# Patient Record
Sex: Male | Born: 1985 | Race: White | Hispanic: No | Marital: Single | Smoking: Current every day smoker
Health system: Southern US, Community
[De-identification: ages and names within clinical notes are randomized; demographics above are authoritative.]

## PROBLEM LIST (undated history)

## (undated) DIAGNOSIS — B1920 Unspecified viral hepatitis C without hepatic coma: Secondary | ICD-10-CM

## (undated) DIAGNOSIS — R519 Headache, unspecified: Secondary | ICD-10-CM

## (undated) DIAGNOSIS — R51 Headache: Secondary | ICD-10-CM

## (undated) DIAGNOSIS — J45909 Unspecified asthma, uncomplicated: Secondary | ICD-10-CM

## (undated) DIAGNOSIS — F1910 Other psychoactive substance abuse, uncomplicated: Secondary | ICD-10-CM

## (undated) DIAGNOSIS — B1910 Unspecified viral hepatitis B without hepatic coma: Secondary | ICD-10-CM

## (undated) DIAGNOSIS — K219 Gastro-esophageal reflux disease without esophagitis: Secondary | ICD-10-CM

## (undated) DIAGNOSIS — N179 Acute kidney failure, unspecified: Secondary | ICD-10-CM

## (undated) HISTORY — PX: NO PAST SURGERIES: SHX2092

## (undated) HISTORY — DX: Unspecified viral hepatitis B without hepatic coma: B19.10

---

## 2018-06-05 ENCOUNTER — Encounter (HOSPITAL_COMMUNITY): Payer: Self-pay | Admitting: *Deleted

## 2018-06-05 ENCOUNTER — Inpatient Hospital Stay (HOSPITAL_COMMUNITY)
Admission: EM | Admit: 2018-06-05 | Discharge: 2018-06-10 | DRG: 918 | Disposition: A | Discharge status: Home / Self Care | Payer: Self-pay | Attending: Internal Medicine | Admitting: Internal Medicine

## 2018-06-05 ENCOUNTER — Emergency Department (HOSPITAL_COMMUNITY): Payer: Self-pay

## 2018-06-05 ENCOUNTER — Other Ambulatory Visit: Payer: Self-pay

## 2018-06-05 DIAGNOSIS — R7989 Other specified abnormal findings of blood chemistry: Secondary | ICD-10-CM

## 2018-06-05 DIAGNOSIS — I959 Hypotension, unspecified: Secondary | ICD-10-CM | POA: Diagnosis present

## 2018-06-05 DIAGNOSIS — F329 Major depressive disorder, single episode, unspecified: Secondary | ICD-10-CM | POA: Diagnosis present

## 2018-06-05 DIAGNOSIS — R945 Abnormal results of liver function studies: Secondary | ICD-10-CM

## 2018-06-05 DIAGNOSIS — T43621A Poisoning by amphetamines, accidental (unintentional), initial encounter: Secondary | ICD-10-CM | POA: Diagnosis present

## 2018-06-05 DIAGNOSIS — Z818 Family history of other mental and behavioral disorders: Secondary | ICD-10-CM

## 2018-06-05 DIAGNOSIS — R740 Nonspecific elevation of levels of transaminase and lactic acid dehydrogenase [LDH]: Secondary | ICD-10-CM

## 2018-06-05 DIAGNOSIS — R7401 Elevation of levels of liver transaminase levels: Secondary | ICD-10-CM | POA: Diagnosis present

## 2018-06-05 DIAGNOSIS — F1120 Opioid dependence, uncomplicated: Secondary | ICD-10-CM | POA: Diagnosis present

## 2018-06-05 DIAGNOSIS — F419 Anxiety disorder, unspecified: Secondary | ICD-10-CM | POA: Diagnosis present

## 2018-06-05 DIAGNOSIS — R000 Tachycardia, unspecified: Secondary | ICD-10-CM | POA: Diagnosis present

## 2018-06-05 DIAGNOSIS — T50904A Poisoning by unspecified drugs, medicaments and biological substances, undetermined, initial encounter: Secondary | ICD-10-CM | POA: Diagnosis present

## 2018-06-05 DIAGNOSIS — F1910 Other psychoactive substance abuse, uncomplicated: Secondary | ICD-10-CM

## 2018-06-05 DIAGNOSIS — F1920 Other psychoactive substance dependence, uncomplicated: Secondary | ICD-10-CM | POA: Diagnosis present

## 2018-06-05 DIAGNOSIS — R6510 Systemic inflammatory response syndrome (SIRS) of non-infectious origin without acute organ dysfunction: Secondary | ICD-10-CM | POA: Diagnosis present

## 2018-06-05 DIAGNOSIS — N179 Acute kidney failure, unspecified: Secondary | ICD-10-CM | POA: Diagnosis present

## 2018-06-05 DIAGNOSIS — T402X1A Poisoning by other opioids, accidental (unintentional), initial encounter: Principal | ICD-10-CM | POA: Diagnosis present

## 2018-06-05 DIAGNOSIS — K828 Other specified diseases of gallbladder: Secondary | ICD-10-CM | POA: Diagnosis present

## 2018-06-05 DIAGNOSIS — F17200 Nicotine dependence, unspecified, uncomplicated: Secondary | ICD-10-CM | POA: Diagnosis present

## 2018-06-05 DIAGNOSIS — E871 Hypo-osmolality and hyponatremia: Secondary | ICD-10-CM

## 2018-06-05 DIAGNOSIS — E876 Hypokalemia: Secondary | ICD-10-CM | POA: Diagnosis present

## 2018-06-05 DIAGNOSIS — B169 Acute hepatitis B without delta-agent and without hepatic coma: Secondary | ICD-10-CM | POA: Diagnosis present

## 2018-06-05 DIAGNOSIS — F1520 Other stimulant dependence, uncomplicated: Secondary | ICD-10-CM | POA: Diagnosis present

## 2018-06-05 HISTORY — DX: Unspecified viral hepatitis C without hepatic coma: B19.20

## 2018-06-05 HISTORY — DX: Gastro-esophageal reflux disease without esophagitis: K21.9

## 2018-06-05 HISTORY — DX: Acute kidney failure, unspecified: N17.9

## 2018-06-05 HISTORY — DX: Other psychoactive substance abuse, uncomplicated: F19.10

## 2018-06-05 HISTORY — DX: Unspecified asthma, uncomplicated: J45.909

## 2018-06-05 HISTORY — DX: Headache: R51

## 2018-06-05 HISTORY — DX: Headache, unspecified: R51.9

## 2018-06-05 LAB — BASIC METABOLIC PANEL
Anion gap: 12 (ref 5–15)
BUN: 17 mg/dL (ref 6–20)
CO2: 18 mmol/L — ABNORMAL LOW (ref 22–32)
Calcium: 8.3 mg/dL — ABNORMAL LOW (ref 8.9–10.3)
Chloride: 102 mmol/L (ref 98–111)
Creatinine, Ser: 1.56 mg/dL — ABNORMAL HIGH (ref 0.61–1.24)
GFR calc Af Amer: >60 mL/min (ref >60)
GFR calc non Af Amer: 58 mL/min — ABNORMAL LOW (ref >60)
GLUCOSE: 119 mg/dL — AB (ref 70–99)
Potassium: 3 mmol/L — ABNORMAL LOW (ref 3.5–5.1)
Sodium: 132 mmol/L — ABNORMAL LOW (ref 135–145)

## 2018-06-05 LAB — CBC
HEMATOCRIT: 44.2 % (ref 39.0–52.0)
Hemoglobin: 14.5 g/dL (ref 13.0–17.0)
MCH: 27.7 pg (ref 26.0–34.0)
MCHC: 32.8 g/dL (ref 30.0–36.0)
MCV: 84.5 fL (ref 80.0–100.0)
Platelets: 221 10*3/uL (ref 150–400)
RBC: 5.23 MIL/uL (ref 4.22–5.81)
RDW: 13.1 % (ref 11.5–15.5)
WBC: 4.4 10*3/uL (ref 4.0–10.5)
nRBC: 0 % (ref 0.0–0.2)

## 2018-06-05 LAB — MAGNESIUM: Magnesium: 0.9 mg/dL — CL (ref 1.7–2.4)

## 2018-06-05 LAB — HEPATIC FUNCTION PANEL
ALT: 1605 U/L — ABNORMAL HIGH (ref 0–44)
AST: 652 U/L — AB (ref 15–41)
Albumin: 3.3 g/dL — ABNORMAL LOW (ref 3.5–5.0)
Alkaline Phosphatase: 159 U/L — ABNORMAL HIGH (ref 38–126)
Bilirubin, Direct: 1.9 mg/dL — ABNORMAL HIGH (ref 0.0–0.2)
Indirect Bilirubin: 1.5 mg/dL — ABNORMAL HIGH (ref 0.3–0.9)
Total Bilirubin: 3.4 mg/dL — ABNORMAL HIGH (ref 0.3–1.2)
Total Protein: 6.6 g/dL (ref 6.5–8.1)

## 2018-06-05 LAB — I-STAT TROPONIN, ED: Troponin i, poc: 0 ng/mL (ref 0.00–0.08)

## 2018-06-05 LAB — SALICYLATE LEVEL: Salicylate Lvl: <7 mg/dL (ref 2.8–30.0)

## 2018-06-05 LAB — PROTIME-INR
INR: 1.42
Prothrombin Time: 17.2 seconds — ABNORMAL HIGH (ref 11.4–15.2)

## 2018-06-05 LAB — ETHANOL: Alcohol, Ethyl (B): <10 mg/dL (ref <10)

## 2018-06-05 LAB — ACETAMINOPHEN LEVEL: Acetaminophen (Tylenol), Serum: <10 ug/mL — ABNORMAL LOW (ref 10–30)

## 2018-06-05 MED ORDER — POTASSIUM CHLORIDE 20 MEQ PO PACK
40.0000 meq | PACK | Freq: Once | ORAL | Status: DC
  Filled 2018-06-05: qty 2

## 2018-06-05 MED ORDER — POTASSIUM CHLORIDE CRYS ER 20 MEQ PO TBCR
40.0000 meq | EXTENDED_RELEASE_TABLET | Freq: Once | ORAL | Status: AC
  Administered 2018-06-05: 40 meq via ORAL
  Filled 2018-06-05: qty 2

## 2018-06-05 MED ORDER — LORAZEPAM 2 MG/ML IJ SOLN
2.0000 mg | Freq: Once | INTRAMUSCULAR | Status: AC
  Administered 2018-06-05: 2 mg via INTRAVENOUS
  Filled 2018-06-05: qty 1

## 2018-06-05 MED ORDER — LORAZEPAM 2 MG/ML IJ SOLN
2.0000 mg | Freq: Once | INTRAMUSCULAR | Status: AC
  Administered 2018-06-06: 2 mg via INTRAVENOUS
  Filled 2018-06-05: qty 1

## 2018-06-05 MED ORDER — LORAZEPAM 1 MG PO TABS
2.0000 mg | ORAL_TABLET | Freq: Once | ORAL | Status: AC
  Administered 2018-06-05: 2 mg via ORAL
  Filled 2018-06-05: qty 2

## 2018-06-05 MED ORDER — POTASSIUM CHLORIDE 10 MEQ/100ML IV SOLN: 10.0000 meq | INTRAVENOUS | Status: DC

## 2018-06-05 MED ORDER — LACTATED RINGERS IV BOLUS
1000.0000 mL | Freq: Once | INTRAVENOUS | Status: AC
  Administered 2018-06-05: 1000 mL via INTRAVENOUS

## 2018-06-05 MED ORDER — POTASSIUM CHLORIDE IN NACL 20-0.9 MEQ/L-% IV SOLN
INTRAVENOUS | Status: DC
  Administered 2018-06-06 (×2): via INTRAVENOUS
  Filled 2018-06-05 (×2): qty 1000

## 2018-06-05 MED ORDER — THIAMINE HCL 100 MG/ML IJ SOLN
100.0000 mg | Freq: Once | INTRAMUSCULAR | Status: AC
  Administered 2018-06-05: 100 mg via INTRAVENOUS
  Filled 2018-06-05: qty 2

## 2018-06-05 MED ORDER — DEXTROSE 5 % IV BOLUS
1000.0000 mL | Freq: Once | INTRAVENOUS | Status: AC
  Administered 2018-06-05: 1000 mL via INTRAVENOUS

## 2018-06-05 NOTE — ED Notes (Signed)
Pt reports injecting a 15mg  crushed up oxycontin. Pt reports he has done this previously as well as chronic injection of meth, heroin, and other substances.

## 2018-06-05 NOTE — ED Triage Notes (Signed)
Pt injected an oxy 15 mg medication; was c/o of bodyaches so family gave ibuprofen for pain then pt became pale and diaphoretic. Pt appears confused at times and is difficult to get information about situation.

## 2018-06-05 NOTE — ED Provider Notes (Signed)
[facility] EMERGENCY DEPARTMENT Provider Note   CSN: [id number] Arrival date & time: 06/05/18  2028     History   Chief Complaint Chief Complaint  Patient presents with  . Drug Overdose    HPI Travis Zimmerman is a 33 y.o. male.  HPI  Patient is a 33 year old male with a past medical history of polysubstance abuse who presents for evaluation after reportedly injecting crushed oxycodone as well as smoking methamphetamine and marijuana and drinking alcohol earlier today.  Patient states this caused him to have severe palpitations and some mild shortness of breath.  Patient denies any other significant past medical history.  He denies any recent fevers, vomiting, diarrhea, dysuria, blood in stool, blood in his urine, chest pain, abdominal pain, extremity pain, recent traumatic injuries, or other acute complaints.  Denies prior similar episodes.  Denies alleviating or aggravating factors.  Denies SI or HI.  Denies any visual or auditory hallucinations.  Denies recent travel or sick contacts.  Patient denies any history of PE/DVT, malignancy, CVA, recent surgery, recent embolization, or currently being on blood thinners.  No past medical history on file.   Patient denies any significant past medical history or past surgical history.  There are no active problems to display for this patient.    Home Medications    Prior to Admission medications   Not on File  Denies taking any medications at home.  Family History No family history on file. Denies any family history of problems with anesthesia.  Social History Social History   Tobacco Use  . Smoking status: Current Every Day Smoker  . Smokeless tobacco: Never Used  Substance Use Topics  . Alcohol use: Yes  . Drug use: Yes    Types: IV     Allergies   Patient has no known allergies.   Review of Systems Review of Systems  Constitutional: Negative for chills and fever.  HENT: Negative for ear pain and  sore throat.   Eyes: Negative for pain and visual disturbance.  Respiratory: Positive for shortness of breath. Negative for cough.   Cardiovascular: Positive for palpitations. Negative for chest pain.  Gastrointestinal: Negative for abdominal pain and vomiting.  Genitourinary: Negative for dysuria and hematuria.  Musculoskeletal: Negative for arthralgias and back pain.  Skin: Negative for color change and rash.  Neurological: Negative for seizures and syncope.  All other systems reviewed and are negative.    Physical Exam Updated Vital Signs BP 101/69   Pulse (!) 132   Resp 19   SpO2 96%   Physical Exam Vitals signs and nursing note reviewed.  Constitutional:      Appearance: Normal appearance. He is well-developed and normal weight.  HENT:     Head: Normocephalic and atraumatic.     Right Ear: External ear normal.     Left Ear: External ear normal.     Nose: Nose normal.     Mouth/Throat:     Mouth: Mucous membranes are moist.  Eyes:     Conjunctiva/sclera: Conjunctivae normal.  Neck:     Musculoskeletal: Neck supple.  Cardiovascular:     Rate and Rhythm: Regular rhythm. Tachycardia present.     Pulses: Normal pulses.     Heart sounds: No murmur.  Pulmonary:     Effort: Pulmonary effort is normal. No respiratory distress.     Breath sounds: Normal breath sounds.  Abdominal:     Palpations: Abdomen is soft.     Tenderness: There is no abdominal  tenderness.  Skin:    General: Skin is warm and dry.     Capillary Refill: Capillary refill takes less than 2 seconds.     Comments: Patient is noted to have bilateral track marks.   Neurological:     General: No focal deficit present.     Mental Status: He is alert and oriented to person, place, and time.      ED Treatments / Results  Labs (all labs ordered are listed, but only abnormal results are displayed) Labs Reviewed  BASIC METABOLIC PANEL - Abnormal; Notable for the following components:      Result Value     Sodium 132 (*)    Potassium 3.0 (*)    CO2 18 (*)    Glucose, Bld 119 (*)    Creatinine, Ser 1.56 (*)    Calcium 8.3 (*)    GFR calc non Af Amer 58 (*)    All other components within normal limits  HEPATIC FUNCTION PANEL - Abnormal; Notable for the following components:   Albumin 3.3 (*)    AST 652 (*)    ALT 1,605 (*)    Alkaline Phosphatase 159 (*)    Total Bilirubin 3.4 (*)    Bilirubin, Direct 1.9 (*)    Indirect Bilirubin 1.5 (*)    All other components within normal limits  ACETAMINOPHEN LEVEL - Abnormal; Notable for the following components:   Acetaminophen (Tylenol), Serum <10 (*)    All other components within normal limits  PROTIME-INR - Abnormal; Notable for the following components:   Prothrombin Time 17.2 (*)    All other components within normal limits  CBC  SALICYLATE LEVEL  ETHANOL  RAPID URINE DRUG SCREEN, HOSP PERFORMED  MAGNESIUM  HEPATITIS PANEL, ACUTE  HIV ANTIBODY (ROUTINE TESTING W REFLEX)  CK  T4, FREE  TSH  I-STAT TROPONIN, ED    EKG EKG Interpretation  Date/Time:  Saturday June 05 2018 20:38:10 EST Ventricular Rate:  158 PR Interval:    QRS Duration: 72 QT Interval:  308 QTC Calculation: 499 R Axis:   61 Text Interpretation:  Sinus tachycardia ST & T wave abnormality, consider lateral ischemia Abnormal ECG Confirmed by Elnora Morrison [phone]) on 06/05/2018 8:51:25 PM   Radiology Dg Chest Port 1 View  Result Date: 06/05/2018 CLINICAL DATA:  33 y/o  M; tachycardia. EXAM: PORTABLE CHEST 1 VIEW COMPARISON:  None. FINDINGS: The heart size and mediastinal contours are within normal limits. Both lungs are clear. The visualized skeletal structures are unremarkable. IMPRESSION: No active disease. Electronically Signed   By: Kristine Garbe M.D.   On: 06/05/2018 21:20    Procedures Procedures (including critical care time)  Medications Ordered in ED Medications  LORazepam (ATIVAN) injection 2 mg (has no administration in time  range)  0.9 % NaCl with KCl 20 mEq/ L  infusion (has no administration in time range)  lactated ringers bolus 1,000 mL (0 mLs Intravenous Stopped 06/05/18 2124)  LORazepam (ATIVAN) injection 2 mg (2 mg Intravenous Given 06/05/18 2125)  lactated ringers bolus 1,000 mL (0 mLs Intravenous Stopped 06/05/18 2246)  LORazepam (ATIVAN) tablet 2 mg (2 mg Oral Given 06/05/18 2205)  potassium chloride SA (K-DUR,KLOR-CON) CR tablet 40 mEq (40 mEq Oral Given 06/05/18 2209)  thiamine (B-1) injection 100 mg (100 mg Intravenous Given 06/05/18 2306)  dextrose 5 % bolus 1,000 mL (1,000 mLs Intravenous New Bag/Given 06/05/18 2306)     Initial Impression / Assessment and Plan / ED Course  I have reviewed  the triage vital signs and the nursing notes.  Pertinent labs & imaging results that were available during my care of the patient were reviewed by me and considered in my medical decision making (see chart for details).     Patient is a 34 year old male who presents above-stated history exam.  On presentation patient is noted to have a heart rate of 160 with otherwise stable vital signs.Exam as above remarkable for no focal neurological deficits, lungs clear to auscultation bilaterally, abdomen soft nontender, and bilateral track marks without any other significant physical exam findings. Initial EKG shows a ventricular rate of 46 with sinus tachycardia, normal intervals, normal axis, and no signs of acute ischemic change.  Troponin is undetectable.  I would low suspicion for ACS or PE.  History exam is not concerning for an aortic dissection given patient denies chest pain and has intact extremity pulses.  Chest x-ray does not show any findings suggestive of pneumonia, pneumothorax, pulmonary edema, pleural effusion, or other acute intrathoracic abnormalities.  CBC shows a WBC count of 4.4, hemoglobin 14.5, platelets 221.  BMP shows NA 132, K3, CO2 18, glucose 119, creatinine 1.56 with an anion gap of 12.  Hepatic function panel  shows an AST of 652, ALT of 1605, alk phos of 159.  INR is 1.42.  Serum salicylates and acetaminophen undetectable.  Patient given 3 L IV fluids and 6 mg of Ativan for concern for sympathomimetic ingestion causing patient's tachycardia.  This resulted in a decrease in his heart rate initially seen in the 160s to 120s.  Care assumed by Dr. Wilson Singer at approximately 2355.  Plan is to likely admit for persistent tachycardia, elevated LFTs, concern for AKI, and electrolyte abnormalities.   Final Clinical Impressions(s) / ED Diagnoses   Final diagnoses:  Polysubstance abuse ([place])  Elevated LFTs  Hyponatremia  Hypokalemia  Tachycardia    ED Discharge Orders    None       Hulan Saas, MD 06/05/18 2359    Elnora Morrison, MD 06/06/18 0025

## 2018-06-06 ENCOUNTER — Inpatient Hospital Stay (HOSPITAL_COMMUNITY): Payer: Self-pay

## 2018-06-06 ENCOUNTER — Other Ambulatory Visit: Payer: Self-pay

## 2018-06-06 ENCOUNTER — Encounter (HOSPITAL_COMMUNITY): Payer: Self-pay | Admitting: Internal Medicine

## 2018-06-06 DIAGNOSIS — I959 Hypotension, unspecified: Secondary | ICD-10-CM | POA: Diagnosis present

## 2018-06-06 DIAGNOSIS — R7401 Elevation of levels of liver transaminase levels: Secondary | ICD-10-CM | POA: Diagnosis present

## 2018-06-06 DIAGNOSIS — R6510 Systemic inflammatory response syndrome (SIRS) of non-infectious origin without acute organ dysfunction: Secondary | ICD-10-CM

## 2018-06-06 DIAGNOSIS — R945 Abnormal results of liver function studies: Secondary | ICD-10-CM

## 2018-06-06 DIAGNOSIS — F1910 Other psychoactive substance abuse, uncomplicated: Secondary | ICD-10-CM

## 2018-06-06 DIAGNOSIS — R000 Tachycardia, unspecified: Secondary | ICD-10-CM

## 2018-06-06 DIAGNOSIS — E876 Hypokalemia: Secondary | ICD-10-CM

## 2018-06-06 DIAGNOSIS — F1920 Other psychoactive substance dependence, uncomplicated: Secondary | ICD-10-CM | POA: Diagnosis present

## 2018-06-06 DIAGNOSIS — N179 Acute kidney failure, unspecified: Secondary | ICD-10-CM

## 2018-06-06 DIAGNOSIS — R740 Nonspecific elevation of levels of transaminase and lactic acid dehydrogenase [LDH]: Secondary | ICD-10-CM

## 2018-06-06 DIAGNOSIS — T50904A Poisoning by unspecified drugs, medicaments and biological substances, undetermined, initial encounter: Secondary | ICD-10-CM | POA: Diagnosis present

## 2018-06-06 HISTORY — DX: Acute kidney failure, unspecified: N17.9

## 2018-06-06 HISTORY — DX: Other psychoactive substance abuse, uncomplicated: F19.10

## 2018-06-06 LAB — RESPIRATORY PANEL BY PCR
Adenovirus: NOT DETECTED
Bordetella pertussis: NOT DETECTED
Chlamydophila pneumoniae: NOT DETECTED
Coronavirus 229E: NOT DETECTED
Coronavirus HKU1: NOT DETECTED
Coronavirus NL63: NOT DETECTED
Coronavirus OC43: NOT DETECTED
Influenza A: NOT DETECTED
Influenza B: NOT DETECTED
Metapneumovirus: NOT DETECTED
Mycoplasma pneumoniae: NOT DETECTED
PARAINFLUENZA VIRUS 2-RVPPCR: NOT DETECTED
Parainfluenza Virus 1: NOT DETECTED
Parainfluenza Virus 3: NOT DETECTED
Parainfluenza Virus 4: NOT DETECTED
Respiratory Syncytial Virus: NOT DETECTED
Rhinovirus / Enterovirus: NOT DETECTED

## 2018-06-06 LAB — COMPREHENSIVE METABOLIC PANEL
ALBUMIN: 2.7 g/dL — AB (ref 3.5–5.0)
ALT: 1242 U/L — ABNORMAL HIGH (ref 0–44)
AST: 403 U/L — ABNORMAL HIGH (ref 15–41)
Alkaline Phosphatase: 85 U/L (ref 38–126)
Anion gap: 8 (ref 5–15)
BUN: 15 mg/dL (ref 6–20)
CO2: 16 mmol/L — ABNORMAL LOW (ref 22–32)
Calcium: 7.6 mg/dL — ABNORMAL LOW (ref 8.9–10.3)
Chloride: 117 mmol/L — ABNORMAL HIGH (ref 98–111)
Creatinine, Ser: 1.07 mg/dL (ref 0.61–1.24)
GFR calc Af Amer: >60 mL/min (ref >60)
GFR calc non Af Amer: >60 mL/min (ref >60)
GLUCOSE: 76 mg/dL (ref 70–99)
Potassium: 4.2 mmol/L (ref 3.5–5.1)
SODIUM: 141 mmol/L (ref 135–145)
Total Bilirubin: 2.1 mg/dL — ABNORMAL HIGH (ref 0.3–1.2)
Total Protein: 5.8 g/dL — ABNORMAL LOW (ref 6.5–8.1)

## 2018-06-06 LAB — HIV ANTIBODY (ROUTINE TESTING W REFLEX): HIV Screen 4th Generation wRfx: NONREACTIVE

## 2018-06-06 LAB — LACTIC ACID, PLASMA: Lactic Acid, Venous: 4 mmol/L (ref 0.5–1.9)

## 2018-06-06 LAB — TSH: TSH: 2.241 u[IU]/mL (ref 0.350–4.500)

## 2018-06-06 LAB — CBC
HEMATOCRIT: 33.9 % — AB (ref 39.0–52.0)
Hemoglobin: 11.1 g/dL — ABNORMAL LOW (ref 13.0–17.0)
MCH: 28.4 pg (ref 26.0–34.0)
MCHC: 32.7 g/dL (ref 30.0–36.0)
MCV: 86.7 fL (ref 80.0–100.0)
Platelets: 161 10*3/uL (ref 150–400)
RBC: 3.91 MIL/uL — ABNORMAL LOW (ref 4.22–5.81)
RDW: 13.6 % (ref 11.5–15.5)
WBC: 24.1 10*3/uL — ABNORMAL HIGH (ref 4.0–10.5)
nRBC: 0 % (ref 0.0–0.2)

## 2018-06-06 LAB — T4, FREE: Free T4: 1.1 ng/dL (ref 0.82–1.77)

## 2018-06-06 LAB — URINALYSIS, ROUTINE W REFLEX MICROSCOPIC
Bilirubin Urine: NEGATIVE
Glucose, UA: NEGATIVE mg/dL
Hgb urine dipstick: NEGATIVE
Ketones, ur: NEGATIVE mg/dL
Leukocytes, UA: NEGATIVE
NITRITE: NEGATIVE
PROTEIN: NEGATIVE mg/dL
Specific Gravity, Urine: 1.006 (ref 1.005–1.030)
pH: 5 (ref 5.0–8.0)

## 2018-06-06 LAB — MAGNESIUM: Magnesium: 1.6 mg/dL — ABNORMAL LOW (ref 1.7–2.4)

## 2018-06-06 LAB — RAPID URINE DRUG SCREEN, HOSP PERFORMED
Amphetamines: POSITIVE — AB
Barbiturates: NOT DETECTED
Benzodiazepines: NOT DETECTED
Cocaine: NOT DETECTED
Opiates: NOT DETECTED
TETRAHYDROCANNABINOL: POSITIVE — AB

## 2018-06-06 LAB — CK: Total CK: 228 U/L (ref 49–397)

## 2018-06-06 LAB — I-STAT TROPONIN, ED: Troponin i, poc: 0 ng/mL (ref 0.00–0.08)

## 2018-06-06 MED ORDER — ONDANSETRON HCL 4 MG PO TABS: 4.0000 mg | ORAL_TABLET | Freq: Four times a day (QID) | ORAL | Status: DC | PRN

## 2018-06-06 MED ORDER — ACETAMINOPHEN 650 MG RE SUPP: 650.0000 mg | Freq: Four times a day (QID) | RECTAL | Status: DC | PRN

## 2018-06-06 MED ORDER — ACETAMINOPHEN 325 MG PO TABS: 650.0000 mg | ORAL_TABLET | Freq: Four times a day (QID) | ORAL | Status: DC | PRN

## 2018-06-06 MED ORDER — MAGNESIUM SULFATE 2 GM/50ML IV SOLN
2.0000 g | Freq: Once | INTRAVENOUS | Status: AC
  Administered 2018-06-06: 2 g via INTRAVENOUS
  Filled 2018-06-06: qty 50

## 2018-06-06 MED ORDER — ADULT MULTIVITAMIN W/MINERALS CH
1.0000 | ORAL_TABLET | Freq: Every day | ORAL | Status: DC
  Administered 2018-06-06 – 2018-06-10 (×5): 1 via ORAL
  Filled 2018-06-06 (×5): qty 1

## 2018-06-06 MED ORDER — VITAMIN B-1 100 MG PO TABS
100.0000 mg | ORAL_TABLET | Freq: Every day | ORAL | Status: DC
  Administered 2018-06-06 – 2018-06-10 (×5): 100 mg via ORAL
  Filled 2018-06-06 (×5): qty 1

## 2018-06-06 MED ORDER — THIAMINE HCL 100 MG/ML IJ SOLN: 100.0000 mg | Freq: Every day | INTRAMUSCULAR | Status: DC

## 2018-06-06 MED ORDER — FOLIC ACID 1 MG PO TABS
1.0000 mg | ORAL_TABLET | Freq: Every day | ORAL | Status: DC
  Administered 2018-06-06 – 2018-06-10 (×5): 1 mg via ORAL
  Filled 2018-06-06 (×5): qty 1

## 2018-06-06 MED ORDER — VANCOMYCIN HCL 10 G IV SOLR
1500.0000 mg | Freq: Once | INTRAVENOUS | Status: AC
  Administered 2018-06-06: 1500 mg via INTRAVENOUS
  Filled 2018-06-06: qty 1500

## 2018-06-06 MED ORDER — ACETYLCYSTEINE 20 % IN SOLN
6000.0000 mg | RESPIRATORY_TRACT | Status: DC
  Administered 2018-06-06 – 2018-06-07 (×6): 6000 mg via ORAL
  Filled 2018-06-06 (×8): qty 30

## 2018-06-06 MED ORDER — SODIUM CHLORIDE 0.9 % IV SOLN
2.0000 g | INTRAVENOUS | Status: DC
  Administered 2018-06-06 (×2): 2 g via INTRAVENOUS
  Filled 2018-06-06 (×4): qty 20

## 2018-06-06 MED ORDER — LORAZEPAM 1 MG PO TABS: 1.0000 mg | ORAL_TABLET | Freq: Four times a day (QID) | ORAL | Status: DC | PRN

## 2018-06-06 MED ORDER — LORAZEPAM 2 MG/ML IJ SOLN
1.0000 mg | Freq: Four times a day (QID) | INTRAMUSCULAR | Status: DC | PRN
  Administered 2018-06-06: 1 mg via INTRAVENOUS
  Filled 2018-06-06: qty 1

## 2018-06-06 MED ORDER — VANCOMYCIN HCL IN DEXTROSE 1-5 GM/200ML-% IV SOLN
1000.0000 mg | Freq: Two times a day (BID) | INTRAVENOUS | Status: DC
  Administered 2018-06-06: 1000 mg via INTRAVENOUS
  Filled 2018-06-06 (×2): qty 200

## 2018-06-06 MED ORDER — SODIUM CHLORIDE 0.9 % IV SOLN
1.0000 g | Freq: Once | INTRAVENOUS | Status: DC
  Administered 2018-06-06: 1 g via INTRAVENOUS

## 2018-06-06 MED ORDER — ACETYLCYSTEINE 20 % IN SOLN
140.0000 mg/kg | Freq: Once | RESPIRATORY_TRACT | Status: AC
  Administered 2018-06-06: 11740 mg via ORAL
  Filled 2018-06-06: qty 60

## 2018-06-06 MED ORDER — SODIUM CHLORIDE 0.9 % IV SOLN
INTRAVENOUS | Status: DC
  Administered 2018-06-06 – 2018-06-08 (×6): via INTRAVENOUS

## 2018-06-06 MED ORDER — ENOXAPARIN SODIUM 40 MG/0.4ML ~~LOC~~ SOLN
40.0000 mg | SUBCUTANEOUS | Status: DC
  Administered 2018-06-06 – 2018-06-10 (×5): 40 mg via SUBCUTANEOUS
  Filled 2018-06-06 (×6): qty 0.4

## 2018-06-06 MED ORDER — TRAZODONE HCL 50 MG PO TABS
50.0000 mg | ORAL_TABLET | Freq: Once | ORAL | Status: AC
  Administered 2018-06-06: 50 mg via ORAL
  Filled 2018-06-06: qty 1

## 2018-06-06 MED ORDER — ONDANSETRON HCL 4 MG/2ML IJ SOLN: 4.0000 mg | Freq: Four times a day (QID) | INTRAMUSCULAR | Status: DC | PRN

## 2018-06-06 NOTE — H&P (Signed)
History and Physical    Travis LowJeremy Zimmerman AVW:[id number] DOB: 29-Sep-1985 DOA: 06/05/2018  PCP: No primary care provider on file.  Patient coming from: Home  I have personally briefly reviewed [facility]  Chief Complaint: Drug OD  HPI: Travis LowJeremy Zimmerman is a 33 y.o. male with medical history significant of polysubstance abuse.  Patient presents to ED for evaluation after injecting crushed oxycodone, smoking methamphetamine and THC, and drinking EtOH all earlier today.  Patient states that this caused him to have severe palpitations and some mild SOB.  No other significant PMH.  No recent fevers, vomiting, diarrhea, hematochezia, CP, abd pain.  No SI / HI.  No sick contacts.  No cough.   ED Course: Initially tachycardic to 160, has improved to sinus at 124 after multiple rounds of ativan.  BPs have remained 'soft' (80s systolic with MAP in the Zimmerman 60s) despite 3L IVF bolus thus far in ED.  Tm 100.6.  CXR neg.  Patient put on empiric rocephin and vanc for possible bacteremia.   Review of Systems: As per HPI otherwise 10 point review of systems negative.   Past Medical History:  Diagnosis Date  . Polysubstance abuse (HCC)     History reviewed. No pertinent surgical history.   reports that he has been smoking. He has never used smokeless tobacco. He reports current alcohol use. He reports current drug use. Drug: IV.  No Known Allergies  Family History  Problem Relation Age of Onset  . Anesthesia problems Neg Hx     Prior to Admission medications   Not on File    Physical Exam: Vitals:   06/05/18 2215 06/05/18 2230 06/05/18 2300 06/06/18 0000  BP: (!) 119/52 114/89 101/69   Pulse: (!) 141 (!) 140 (!) 132   Resp: (!) 35 19 19   Temp:    (!) 100.6 F (38.1 C)  TempSrc:    Rectal  SpO2: 97% 97% 96%     Constitutional: NAD, calm, comfortable Eyes: PERRL, lids and conjunctivae normal ENMT: Mucous membranes are moist. Posterior pharynx clear of  any exudate or lesions.Normal dentition.  Neck: normal, supple, no masses, no thyromegaly Respiratory: clear to auscultation bilaterally, no wheezing, no crackles. Normal respiratory effort. No accessory muscle use.  Cardiovascular: Tachycardic, I cant tell if a murmur is present or not.  Abdomen: no tenderness, no masses palpated. No hepatosplenomegaly. Bowel sounds positive.  Musculoskeletal: no clubbing / cyanosis. No joint deformity upper and lower extremities. Good ROM, no contractures. Normal muscle tone.  Skin: Track marks on skin, has area on palm of hand that I would be suspicious for a Janeway lesion, but it is tender and he thinks he fell on a piece of glass. Neurologic: CN 2-12 grossly intact. Sensation intact, DTR normal. Strength 5/5 in all 4.  Psychiatric: Normal judgment and insight. Alert and oriented x 3. Normal mood.    Labs on Admission: I have personally reviewed following labs and imaging studies  CBC: Recent Labs  Lab 06/05/18 2045  WBC 4.4  HGB 14.5  HCT 44.2  MCV 84.5  PLT 221   Basic Metabolic Panel: Recent Labs  Lab 06/05/18 2045 06/05/18 2303  NA 132*  --   K 3.0*  --   CL 102  --   CO2 18*  --   GLUCOSE 119*  --   BUN 17  --   CREATININE 1.56*  --   CALCIUM 8.3*  --   MG  --  0.9*   GFR: CrCl cannot be calculated (Unknown ideal weight.). Liver Function Tests: Recent Labs  Lab 06/05/18 2052  AST 652*  ALT 1,605*  ALKPHOS 159*  BILITOT 3.4*  PROT 6.6  ALBUMIN 3.3*   No results for input(s): LIPASE, AMYLASE in the last 168 hours. No results for input(s): AMMONIA in the last 168 hours. Coagulation Profile: Recent Labs  Lab 06/05/18 2303  INR 1.42   Cardiac Enzymes: Recent Labs  Lab 06/05/18 2352  CKTOTAL 228   BNP (last 3 results) No results for input(s): PROBNP in the last 8760 hours. HbA1C: No results for input(s): HGBA1C in the last 72 hours. CBG: No results for input(s): GLUCAP in the last 168 hours. Lipid  Profile: No results for input(s): CHOL, HDL, LDLCALC, TRIG, CHOLHDL, LDLDIRECT in the last 72 hours. Thyroid Function Tests: No results for input(s): TSH, T4TOTAL, FREET4, T3FREE, THYROIDAB in the last 72 hours. Anemia Panel: No results for input(s): VITAMINB12, FOLATE, FERRITIN, TIBC, IRON, RETICCTPCT in the last 72 hours. Urine analysis:    Component Value Date/Time   COLORURINE YELLOW 06/05/2018 2350   APPEARANCEUR CLEAR 06/05/2018 2350   LABSPEC 1.006 06/05/2018 2350   PHURINE 5.0 06/05/2018 2350   GLUCOSEU NEGATIVE 06/05/2018 2350   HGBUR NEGATIVE 06/05/2018 2350   BILIRUBINUR NEGATIVE 06/05/2018 2350   KETONESUR NEGATIVE 06/05/2018 2350   PROTEINUR NEGATIVE 06/05/2018 2350   NITRITE NEGATIVE 06/05/2018 2350   LEUKOCYTESUR NEGATIVE 06/05/2018 2350    Radiological Exams on Admission: Dg Chest Port 1 View  Result Date: 06/05/2018 CLINICAL DATA:  33 y/o  M; tachycardia. EXAM: PORTABLE CHEST 1 VIEW COMPARISON:  None. FINDINGS: The heart size and mediastinal contours are within normal limits. Both lungs are clear. The visualized skeletal structures are unremarkable. IMPRESSION: No active disease. Electronically Signed   By: Mitzi HansenLance  Furusawa-Stratton M.D.   On: 06/05/2018 21:20    EKG: Independently reviewed.  Assessment/Plan Principal Problem:   OD (overdose of drug), undetermined intent, initial encounter Active Problems:   Transaminitis   SIRS (systemic inflammatory response syndrome) (HCC)   Polysubstance dependence including opioid type drug with complication, episodic abuse (HCC)   Hypokalemia   Hypomagnesemia   Hypotension   AKI (acute kidney injury) (HCC)    1. OD - 1. PRN ativan 2. Tele monitor for tachycardia 3. See below for discussion of other findings 2. SIRS - with hypotension 1. Empiric rocephin and vanc for possible sepsis 2. BCx pending 3. If positive, would have Zimmerman threshold for obtaining TEE to r/o endocarditis on this patient 4. IVF: 3L bolus in ED  and NS at 125 cc/hr 5. Despite MAP of 60, patient not really symptomatic at all 3. Transaminitis - 1. Acute hepatitis pnl 2. Empiric acetadote per pharm, Tylenol level is negative but if hes crushing up oxy and injecting recently, it seems more likely than not that some of this may have been percocet (containing tylenol) in recent days. 3. RUQ US 4. Repeat CMP in AM 4. AKI - 1. Renal insufficiency presumed acute, likely pre-renal due to hypotension / SIRS 2. IVF, repeat CMP in AM 5. Polysubstance abuse - 1. PRN ativan for now 2. Consider Opiate replacement therapy discussion in AM 3. CIWA 4. SW consult 6. Hypomag, hypo k - replace  DVT prophylaxis: Lovenox Code Status: Full Family Communication: Family at bedside Disposition Plan: TBD Consults called: None Admission status: Admit to inpatient  Severity of Illness: The appropriate patient status for this patient is INPATIENT. Inpatient status is judged  to be reasonable and necessary in order to provide the required intensity of service to ensure the patient's safety. The patient's presenting symptoms, physical exam findings, and initial radiographic and laboratory data in the context of their chronic comorbidities is felt to place them at high risk for further clinical deterioration. Furthermore, it is not anticipated that the patient will be medically stable for discharge from the hospital within 2 midnights of admission. The following factors support the patient status of inpatient.   " The patient's presenting symptoms include Palpitations, multiple IV drug use injections, fever. " The worrisome physical exam findings include Tachycardia to 160s, Fever 100.6, hypotension with SBP in the 80s. " The initial radiographic and laboratory data are worrisome because of LFTs in the 1000s, Creat 1.5 unk baseline. " The chronic co-morbidities include Polysubstance abuse.   * I certify that at the point of admission it is my clinical judgment  that the patient will require inpatient hospital care spanning beyond 2 midnights from the point of admission due to high intensity of service, high risk for further deterioration and high frequency of surveillance required.Hillary Bow DO [facility] Pager [phone] Only works nights!  If 7AM-7PM, please contact the primary day team physician taking care of patient  [email or web address] Password [email or web address]  06/06/2018, 1:08 AM

## 2018-06-06 NOTE — ED Notes (Signed)
BFAST TRAY ORDERED 

## 2018-06-06 NOTE — Progress Notes (Signed)
[place] TEAM 1 - Stepdown/ICU TEAM  Alyse LowJeremy Dobosz  ZOX:[id number] DOB: [birth year] DOA: 06/05/2018 PCP: No primary care provider on file.    Brief Narrative:  33yo M w/ a hx of polysubstance abuse who injected crushed oxycodone +/- percocet, smoked methamphetamine and THC, and drank EtOH all at the same time, then presented to the ED w/ severe palpitations and SOB.   In the ED he was tachycardic to 160.   Significant Events: 1/5 admit   Subjective: Pt is seen for a f/u visit.    Assessment & Plan:  OD on multiple illicit drugs  SIRS w/ hypotension   Transaminitis  Possibly related to APAP OD (possible injection of crushed Percocet) - cont oral acetadote - f/u CMET and INR in AM - follow QTc  Recent Labs  Lab 06/05/18 2052 06/06/18 0902  AST 652* 403*  ALT 1,605* 1,242*  ALKPHOS 159* 85  BILITOT 3.4* 2.1*  PROT 6.6 5.8*  ALBUMIN 3.3* 2.7*    Acute kidney injury   Polysubstance abuse   Severe Hypomagnesemia  Hypokalemia    DVT prophylaxis: lovenox  Code Status: FULL CODE Family Communication:  Disposition Plan:   Consultants:  none  Antimicrobials:  Rocephin Vanc   Objective: Blood pressure 90/76, pulse (!) 108, temperature 98.2 F (36.8 C), temperature source Oral, resp. rate 18, height 5\' 10"  (1.778 m), weight 87 kg, SpO2 99 %.  Intake/Output Summary (Last 24 hours) at 06/06/2018 1633 Last data filed at 06/06/2018 1300 Gross per 24 hour  Intake 4951.67 ml  Output 2300 ml  Net 2651.67 ml   Filed Weights   06/06/18 0132 06/06/18 0606  Weight: 83.9 kg 87 kg    Examination: Pt was seen for a f/u visit.    CBC: Recent Labs  Lab 06/05/18 2045 06/06/18 1000  WBC 4.4 24.1*  HGB 14.5 11.1*  HCT 44.2 33.9*  MCV 84.5 86.7  PLT 221 161   Basic Metabolic Panel: Recent Labs  Lab 06/05/18 2045 06/05/18 2303 06/06/18 0902  NA 132*  --  141  K 3.0*  --  4.2  CL 102  --  117*  CO2 18*  --  16*  GLUCOSE 119*  --  76  BUN 17  --  15    CREATININE 1.56*  --  1.07  CALCIUM 8.3*  --  7.6*  MG  --  0.9*  --    GFR: Estimated Creatinine Clearance: 103.3 mL/min (by C-G formula based on SCr of 1.07 mg/dL).  Liver Function Tests: Recent Labs  Lab 06/05/18 2052 06/06/18 0902  AST 652* 403*  ALT 1,605* 1,242*  ALKPHOS 159* 85  BILITOT 3.4* 2.1*  PROT 6.6 5.8*  ALBUMIN 3.3* 2.7*    Coagulation Profile: Recent Labs  Lab 06/05/18 2303  INR 1.42    Cardiac Enzymes: Recent Labs  Lab 06/05/18 2352  CKTOTAL 228     Recent Results (from the past 240 hour(s))  Blood culture (routine x 2)     Status: None (Preliminary result)   Collection Time: 06/06/18  1:03 AM  Result Value Ref Range Status   Specimen Description BLOOD LEFT HAND  Final   Special Requests   Final    BOTTLES DRAWN AEROBIC AND ANAEROBIC Blood Culture results may not be optimal due to an inadequate volume of blood received in culture bottles   Culture NO GROWTH < 12 HOURS  Final   Report Status PENDING  Incomplete  Culture, blood (Routine X 2) w Reflex  to ID Panel     Status: None (Preliminary result)   Collection Time: 06/06/18  1:25 AM  Result Value Ref Range Status   Specimen Description BLOOD RIGHT ANTECUBITAL  Final   Special Requests   Final    BOTTLES DRAWN AEROBIC AND ANAEROBIC Blood Culture results may not be optimal due to an inadequate volume of blood received in culture bottles   Culture NO GROWTH < 12 HOURS  Final   Report Status PENDING  Incomplete  Respiratory Panel by PCR     Status: None   Collection Time: 06/06/18  1:52 AM  Result Value Ref Range Status   Adenovirus NOT DETECTED NOT DETECTED Final   Coronavirus 229E NOT DETECTED NOT DETECTED Final   Coronavirus HKU1 NOT DETECTED NOT DETECTED Final   Coronavirus NL63 NOT DETECTED NOT DETECTED Final   Coronavirus OC43 NOT DETECTED NOT DETECTED Final   Metapneumovirus NOT DETECTED NOT DETECTED Final   Rhinovirus / Enterovirus NOT DETECTED NOT DETECTED Final   Influenza A NOT  DETECTED NOT DETECTED Final   Influenza B NOT DETECTED NOT DETECTED Final   Parainfluenza Virus 1 NOT DETECTED NOT DETECTED Final   Parainfluenza Virus 2 NOT DETECTED NOT DETECTED Final   Parainfluenza Virus 3 NOT DETECTED NOT DETECTED Final   Parainfluenza Virus 4 NOT DETECTED NOT DETECTED Final   Respiratory Syncytial Virus NOT DETECTED NOT DETECTED Final   Bordetella pertussis NOT DETECTED NOT DETECTED Final   Chlamydophila pneumoniae NOT DETECTED NOT DETECTED Final   Mycoplasma pneumoniae NOT DETECTED NOT DETECTED Final     Scheduled Meds: . acetylcysteine  6,000 mg Oral Q4H  . enoxaparin (LOVENOX) injection  40 mg Subcutaneous Q24H  . folic acid  1 mg Oral Daily  . multivitamin with minerals  1 tablet Oral Daily  . thiamine  100 mg Oral Daily   Or  . thiamine  100 mg Intravenous Daily   Continuous Infusions: . sodium chloride 125 mL/hr at 06/06/18 1030  . 0.9 % NaCl with KCl 20 mEq / L 125 mL/hr at 06/06/18 1028  . cefTRIAXone (ROCEPHIN)  IV 2 g (06/06/18 1034)  . vancomycin 1,000 mg (06/06/18 1225)     LOS: 0 days   Time spent: No Charge  Lonia Blood, MD [facility] Office  [phone] Pager - Text Page per Amion as per below:  On-Call/Text Page:      [email or web address]  If 7PM-7AM, please contact night-coverage [email or web address] 06/06/2018, 4:33 PM

## 2018-06-06 NOTE — Progress Notes (Signed)
Discussions today with Travis Zimmerman and his father, Travis Zimmerman, about discharge and possible inpatient drug rehab.  Mr. Travis Zimmerman has been talking with a facility in [facility], [place], [facility] with Travis Zimmerman and the facility about Travis Zimmerman going there and living.  My discussions with Travis Zimmerman were not positive about this option.  I feel that Travis Zimmerman may benefit from speaking with a Substance Abuse Counselor or [facility] for recommendations.  Travis Zimmerman may benefit from a [facility] Travis inpatient detox, may be with Travis Zimmerman which can accommodate patients who do have insurance.  A referral to Case Management may be helpful.

## 2018-06-06 NOTE — Progress Notes (Signed)
Pt arrived to the floor, on the stetcher alert, and ambulated to the bed. Vital signs and weight taken, heart monitor applied, CCMD notified , CHG bath done.

## 2018-06-06 NOTE — Progress Notes (Signed)
Pharmacy note: acetadote monitoring  33 yo male with possible Tylenol overdose (crushed and injected oxycodone but source may have included Percocet)  APAP < 10, INR= 1.42, Mg= 0.9, AST/ALT= 403/1242, QTc= 499  Contacted [place] [facility] and recommendations where as follows  -Continue with Mucomyst (Initial LFTs concerning) - Repeat CMET and INR in am (~ 24hrs post mucomyst load) -Consider repeat QTc to follow QTc - Recheck Mangesium ans replace as needed  Plans -Continue acetadote -CMET, Mg and INR in am -Follow with poison control on 1/6  Harland German, PharmD Clinical Pharmacist **Pharmacist phone directory can now be found on [email or web address] ([email or web address]).  Listed under [facility].

## 2018-06-06 NOTE — Progress Notes (Signed)
MEDICATION RELATED CONSULT NOTE - INITIAL   Pharmacy Consult for Acetylcysteine Indication: transaminitis/poss Tylenol OD   No Known Allergies  Patient Measurements: Height: 5\' 11"  (180.3 cm) Weight: 185 lb (83.9 kg) IBW/kg (Calculated) : 75.3  Vital Signs: Temp: 100.6 F (38.1 C) (01/05 0000) Temp Source: Rectal (01/05 0000) BP: 73/40 (01/05 0315) Pulse Rate: 101 (01/05 0315) Intake/Output from previous day: 01/04 0701 - 01/05 0700 In: 2000 [IV Piggyback:2000] Out: -  Intake/Output from this shift: Total I/O In: 2000 [IV Piggyback:2000] Out: -   Labs: Recent Labs    06/05/18 2045 06/05/18 2052 06/05/18 2303  WBC 4.4  --   --   HGB 14.5  --   --   HCT 44.2  --   --   PLT 221  --   --   CREATININE 1.56*  --   --   MG  --   --  0.9*  ALBUMIN  --  3.3*  --   PROT  --  6.6  --   AST  --  652*  --   ALT  --  1,605*  --   ALKPHOS  --  159*  --   BILITOT  --  3.4*  --   BILIDIR  --  1.9*  --   IBILI  --  1.5*  --    Estimated Creatinine Clearance: 73.1 mL/min (A) (by C-G formula based on SCr of 1.56 mg/dL (H)).   Microbiology: No results found for this or any previous visit (from the past 720 hour(s)).  Medical History: Past Medical History:  Diagnosis Date  . Polysubstance abuse (HCC)     Medications:  No current facility-administered medications on file prior to encounter.    No current outpatient medications on file prior to encounter.     Assessment: 33 y.o. male admitted with polysubstance abuse/overdose, elevated hepatic markers, and possible acetaminophen overdose.  Acetaminophen level < 10 tonight.     Plan:  Acetylcysteine 140 mg/kg po, then 70 mg/kg po q4h  Amiera Herzberg, Gary Fleet 06/06/2018,4:19 AM

## 2018-06-06 NOTE — Plan of Care (Signed)
Care plans reviewed and patient is progressing.  

## 2018-06-06 NOTE — Progress Notes (Signed)
Pharmacy Antibiotic Note  Ordie Badgett is a 33 y.o. male admitted on 06/05/2018 with sepsis.  Pharmacy has been consulted for Vancomycin  dosing.  Plan: Vancomycin 1500 mg IV now, then 1 g IV q12h  Height: 5\' 11"  (180.3 cm) Weight: 185 lb (83.9 kg) IBW/kg (Calculated) : 75.3  Temp (24hrs), Avg:100.6 F (38.1 C), Min:100.6 F (38.1 C), Max:100.6 F (38.1 C)  Recent Labs  Lab 06/05/18 2045  WBC 4.4  CREATININE 1.56*    Estimated Creatinine Clearance: 73.1 mL/min (A) (by C-G formula based on SCr of 1.56 mg/dL (H)).    No Known Allergies  Eddie Candle 06/06/2018 2:07 AM

## 2018-06-07 LAB — CBC
HCT: 37.8 % — ABNORMAL LOW (ref 39.0–52.0)
Hemoglobin: 12.3 g/dL — ABNORMAL LOW (ref 13.0–17.0)
MCH: 28.3 pg (ref 26.0–34.0)
MCHC: 32.5 g/dL (ref 30.0–36.0)
MCV: 86.9 fL (ref 80.0–100.0)
Platelets: 151 10*3/uL (ref 150–400)
RBC: 4.35 MIL/uL (ref 4.22–5.81)
RDW: 13.8 % (ref 11.5–15.5)
WBC: 13.9 10*3/uL — ABNORMAL HIGH (ref 4.0–10.5)
nRBC: 0 % (ref 0.0–0.2)

## 2018-06-07 LAB — COMPREHENSIVE METABOLIC PANEL
ALK PHOS: 103 U/L (ref 38–126)
ALT: 1127 U/L — ABNORMAL HIGH (ref 0–44)
AST: 432 U/L — ABNORMAL HIGH (ref 15–41)
Albumin: 2.6 g/dL — ABNORMAL LOW (ref 3.5–5.0)
Anion gap: 5 (ref 5–15)
BUN: 10 mg/dL (ref 6–20)
CALCIUM: 8 mg/dL — AB (ref 8.9–10.3)
CO2: 20 mmol/L — AB (ref 22–32)
Chloride: 116 mmol/L — ABNORMAL HIGH (ref 98–111)
Creatinine, Ser: 0.92 mg/dL (ref 0.61–1.24)
GFR calc Af Amer: >60 mL/min (ref >60)
GFR calc non Af Amer: >60 mL/min (ref >60)
Glucose, Bld: 94 mg/dL (ref 70–99)
Potassium: 4 mmol/L (ref 3.5–5.1)
Sodium: 141 mmol/L (ref 135–145)
Total Bilirubin: 1.2 mg/dL (ref 0.3–1.2)
Total Protein: 5.6 g/dL — ABNORMAL LOW (ref 6.5–8.1)

## 2018-06-07 LAB — MAGNESIUM: Magnesium: 2.3 mg/dL (ref 1.7–2.4)

## 2018-06-07 LAB — PROTIME-INR
INR: 1.2
Prothrombin Time: 15 seconds (ref 11.4–15.2)

## 2018-06-07 LAB — URINE CULTURE
Culture: NO GROWTH
Special Requests: NORMAL

## 2018-06-07 MED ORDER — INFLUENZA VAC SPLIT QUAD 0.5 ML IM SUSY
0.5000 mL | PREFILLED_SYRINGE | INTRAMUSCULAR | Status: DC
  Filled 2018-06-07: qty 0.5

## 2018-06-07 MED ORDER — PNEUMOCOCCAL VAC POLYVALENT 25 MCG/0.5ML IJ INJ
0.5000 mL | INJECTION | INTRAMUSCULAR | Status: DC
  Filled 2018-06-07: qty 0.5

## 2018-06-07 NOTE — Progress Notes (Addendum)
[facility] TEAM 1 - Stepdown/ICU TEAM  Travis Zimmerman  RUE:[id number] DOB: 01/08/1986 DOA: 06/05/2018 PCP: No primary care provider on file.    Brief Narrative:  33yo M w/ a hx of polysubstance abuse who injected crushed oxycodone +/- percocet, smoked methamphetamine and THC, and drank EtOH all at the same time, then presented to the ED w/ severe palpitations and SOB.   In the ED he was tachycardic to 160.   Significant Events: 1/5 admit   Subjective: The patient is sleeping soundly and is difficult to awaken.  There is no respiratory distress.  There is no evidence of uncontrolled pain.  The patient gave me permission to speak with his family yesterday.  I called and spoke with the staff this morning.  Family is well aware of the patient substance abuse issue and has strongly encouraged him to enter a rehab facility in [facility].  The patient himself is been very resistant to this.  I have explained to the patient's father that I suspect he will be medically stable for discharge as early as 06/08/2018.  I have explained that we will educate the patient on resources available for substance abuse treatment and that we will be very clear about the high likelihood of his death should he continue to abuse these substances.  Assessment & Plan:  OD on multiple illicit drugs - Polysubstance abuse  No evidence of suicidal intention -continue to counsel patient on absolute need to discontinue substances of abuse -connect with all available resources as he will allow  SIRS w/ hypotension  SIRS physiology has resolved - BP stable   Transaminitis  Possibly related to APAP OD (possible injection of crushed Percocet) -Poison control has now suggested stopping Acetadote -continue to follow LFTs  Recent Labs  Lab 06/05/18 2052 06/06/18 0902 06/07/18 0319  AST 652* 403* 432*  ALT 1,605* 1,242* 1,127*  ALKPHOS 159* 85 103  BILITOT 3.4* 2.1* 1.2  PROT 6.6 5.8* 5.6*  ALBUMIN 3.3* 2.7* 2.6*     Acute kidney injury  Renal function has rapidly recovered  Recent Labs  Lab 06/05/18 2045 06/06/18 0902 06/07/18 0319  CREATININE 1.56* 1.07 0.92    Severe Hypomagnesemia Corrected  Hypokalemia  Corrected   DVT prophylaxis: lovenox  Code Status: FULL CODE Family Communication: Spoke with father on phone after permission given by patient Disposition Plan: Anticipate discharge home 1/7  Consultants:  none  Antimicrobials:  Rocephin Vanc   Objective: Blood pressure 120/77, pulse 92, temperature 97.6 F (36.4 C), resp. rate (!) 21, height 5\' 10"  (1.778 m), weight 87 kg, SpO2 100 %.  Intake/Output Summary (Last 24 hours) at 06/07/2018 1048 Last data filed at 06/07/2018 0730 Gross per 24 hour  Intake 3878.42 ml  Output -  Net 3878.42 ml   Filed Weights   06/06/18 0132 06/06/18 0606  Weight: 83.9 kg 87 kg    Examination: General: No acute respiratory distress Lungs: Clear to auscultation bilaterally without wheezes or crackles Cardiovascular: Regular rate and rhythm without murmur gallop or rub normal S1 and S2 Abdomen: Nontender, nondistended, soft, bowel sounds positive, no rebound, no ascites, no appreciable mass Extremities: No significant cyanosis, clubbing, or edema bilateral lower extremities   CBC: Recent Labs  Lab 06/05/18 2045 06/06/18 1000 06/07/18 0319  WBC 4.4 24.1* 13.9*  HGB 14.5 11.1* 12.3*  HCT 44.2 33.9* 37.8*  MCV 84.5 86.7 86.9  PLT 221 161 151   Basic Metabolic Panel: Recent Labs  Lab 06/05/18 2045 06/05/18  2303 06/06/18 0902 06/07/18 0319  NA 132*  --  141 141  K 3.0*  --  4.2 4.0  CL 102  --  117* 116*  CO2 18*  --  16* PENDING  GLUCOSE 119*  --  76 94  BUN 17  --  15 10  CREATININE 1.56*  --  1.07 0.92  CALCIUM 8.3*  --  7.6* 8.0*  MG  --  0.9* 1.6* 2.3   GFR: Estimated Creatinine Clearance: 120.1 mL/min (by C-G formula based on SCr of 0.92 mg/dL).  Liver Function Tests: Recent Labs  Lab 06/05/18 2052  06/06/18 0902 06/07/18 0319  AST 652* 403* 432*  ALT 1,605* 1,242* 1,127*  ALKPHOS 159* 85 103  BILITOT 3.4* 2.1* 1.2  PROT 6.6 5.8* 5.6*  ALBUMIN 3.3* 2.7* 2.6*    Coagulation Profile: Recent Labs  Lab 06/05/18 2303 06/07/18 0319  INR 1.42 1.20    Cardiac Enzymes: Recent Labs  Lab 06/05/18 2352  CKTOTAL 228     Recent Results (from the past 240 hour(s))  Urine culture     Status: None   Collection Time: 06/06/18 12:37 AM  Result Value Ref Range Status   Specimen Description URINE, RANDOM  Final   Special Requests Normal  Final   Culture   Final    NO GROWTH Performed at [facility], [address], [place], [place] [address]    Report Status 06/07/2018 FINAL  Final  Blood culture (routine x 2)     Status: None (Preliminary result)   Collection Time: 06/06/18  1:03 AM  Result Value Ref Range Status   Specimen Description BLOOD LEFT HAND  Final   Special Requests   Final    BOTTLES DRAWN AEROBIC AND ANAEROBIC Blood Culture results may not be optimal due to an inadequate volume of blood received in culture bottles   Culture NO GROWTH < 12 HOURS  Final   Report Status PENDING  Incomplete  Culture, blood (Routine X 2) w Reflex to ID Panel     Status: None (Preliminary result)   Collection Time: 06/06/18  1:25 AM  Result Value Ref Range Status   Specimen Description BLOOD RIGHT ANTECUBITAL  Final   Special Requests   Final    BOTTLES DRAWN AEROBIC AND ANAEROBIC Blood Culture results may not be optimal due to an inadequate volume of blood received in culture bottles   Culture NO GROWTH < 12 HOURS  Final   Report Status PENDING  Incomplete  Respiratory Panel by PCR     Status: None   Collection Time: 06/06/18  1:52 AM  Result Value Ref Range Status   Adenovirus NOT DETECTED NOT DETECTED Final   Coronavirus 229E NOT DETECTED NOT DETECTED Final   Coronavirus HKU1 NOT DETECTED NOT DETECTED Final   Coronavirus NL63 NOT DETECTED NOT DETECTED Final   Coronavirus  OC43 NOT DETECTED NOT DETECTED Final   Metapneumovirus NOT DETECTED NOT DETECTED Final   Rhinovirus / Enterovirus NOT DETECTED NOT DETECTED Final   Influenza A NOT DETECTED NOT DETECTED Final   Influenza B NOT DETECTED NOT DETECTED Final   Parainfluenza Virus 1 NOT DETECTED NOT DETECTED Final   Parainfluenza Virus 2 NOT DETECTED NOT DETECTED Final   Parainfluenza Virus 3 NOT DETECTED NOT DETECTED Final   Parainfluenza Virus 4 NOT DETECTED NOT DETECTED Final   Respiratory Syncytial Virus NOT DETECTED NOT DETECTED Final   Bordetella pertussis NOT DETECTED NOT DETECTED Final   Chlamydophila pneumoniae NOT  DETECTED NOT DETECTED Final   Mycoplasma pneumoniae NOT DETECTED NOT DETECTED Final     Scheduled Meds: . enoxaparin (LOVENOX) injection  40 mg Subcutaneous Q24H  . folic acid  1 mg Oral Daily  . [START ON 06/08/2018] Influenza vac split quadrivalent PF  0.5 mL Intramuscular Tomorrow-1000  . multivitamin with minerals  1 tablet Oral Daily  . [START ON 06/08/2018] pneumococcal 23 valent vaccine  0.5 mL Intramuscular Tomorrow-1000  . thiamine  100 mg Oral Daily   Continuous Infusions: . sodium chloride 125 mL/hr at 06/07/18 0355     LOS: 1 day    Lonia Blood, MD [facility] Office  [phone] Pager - Text Page per Loretha Stapler as per below:  On-Call/Text Page:      [email or web address]  If 7PM-7AM, please contact night-coverage [email or web address] 06/07/2018, 10:48 AM

## 2018-06-08 LAB — CBC
HCT: 35 % — ABNORMAL LOW (ref 39.0–52.0)
Hemoglobin: 11.3 g/dL — ABNORMAL LOW (ref 13.0–17.0)
MCH: 27.8 pg (ref 26.0–34.0)
MCHC: 32.3 g/dL (ref 30.0–36.0)
MCV: 86.2 fL (ref 80.0–100.0)
Platelets: 160 10*3/uL (ref 150–400)
RBC: 4.06 MIL/uL — ABNORMAL LOW (ref 4.22–5.81)
RDW: 13.6 % (ref 11.5–15.5)
WBC: 7.6 10*3/uL (ref 4.0–10.5)
nRBC: 0 % (ref 0.0–0.2)

## 2018-06-08 LAB — COMPREHENSIVE METABOLIC PANEL
ALT: 1279 U/L — ABNORMAL HIGH (ref 0–44)
ANION GAP: 7 (ref 5–15)
AST: 713 U/L — ABNORMAL HIGH (ref 15–41)
Albumin: 2.7 g/dL — ABNORMAL LOW (ref 3.5–5.0)
Alkaline Phosphatase: 121 U/L (ref 38–126)
BUN: 8 mg/dL (ref 6–20)
CO2: 20 mmol/L — ABNORMAL LOW (ref 22–32)
Calcium: 8.4 mg/dL — ABNORMAL LOW (ref 8.9–10.3)
Chloride: 113 mmol/L — ABNORMAL HIGH (ref 98–111)
Creatinine, Ser: 1.01 mg/dL (ref 0.61–1.24)
GFR calc Af Amer: >60 mL/min (ref >60)
GFR calc non Af Amer: >60 mL/min (ref >60)
Glucose, Bld: 90 mg/dL (ref 70–99)
Potassium: 3.6 mmol/L (ref 3.5–5.1)
Sodium: 140 mmol/L (ref 135–145)
Total Bilirubin: 1.1 mg/dL (ref 0.3–1.2)
Total Protein: 5.8 g/dL — ABNORMAL LOW (ref 6.5–8.1)

## 2018-06-08 LAB — PROTIME-INR
INR: 0.99
Prothrombin Time: 13 seconds (ref 11.4–15.2)

## 2018-06-08 LAB — HEPATITIS PANEL, ACUTE
HCV Ab: >11 s/co ratio — ABNORMAL HIGH (ref 0.0–0.9)
Hep A IgM: NEGATIVE
Hep B C IgM: POSITIVE — AB
Hepatitis B Surface Ag: POSITIVE — AB

## 2018-06-08 MED ORDER — IBUPROFEN 200 MG PO TABS
200.0000 mg | ORAL_TABLET | Freq: Four times a day (QID) | ORAL | Status: DC | PRN
  Administered 2018-06-08 – 2018-06-10 (×4): 400 mg via ORAL
  Filled 2018-06-08 (×4): qty 2

## 2018-06-08 MED ORDER — SODIUM CHLORIDE 0.9 % IV SOLN
INTRAVENOUS | Status: DC
  Administered 2018-06-08 – 2018-06-09 (×3): via INTRAVENOUS

## 2018-06-08 MED ORDER — KETOROLAC TROMETHAMINE 30 MG/ML IJ SOLN
30.0000 mg | Freq: Once | INTRAMUSCULAR | Status: AC
  Administered 2018-06-08: 30 mg via INTRAVENOUS
  Filled 2018-06-08: qty 1

## 2018-06-08 NOTE — Plan of Care (Signed)
Care plans reviewed and patient is progressing.  

## 2018-06-08 NOTE — Progress Notes (Signed)
[facility] TEAM 1 - Stepdown/ICU TEAM  Travis Zimmerman  ZOX:[id number] DOB: 1985-06-30 DOA: 06/05/2018 PCP: No primary care provider on file.    Brief Narrative:  33yo M w/ a hx of polysubstance abuse who injected crushed oxycodone +/- percocet, smoked methamphetamine and THC, and drank EtOH all at the same time, then presented to the ED w/ severe palpitations and SOB.   In the ED he was tachycardic to 160.   Significant Events: 1/5 admit   Subjective: The patient is sitting up in bed with no complaints.  He denies chest pain shortness of breath fevers chills nausea or vomiting.  I spoke with him very directly honestly and frankly about his substance abuse and the fact that ongoing abuse would likely lead to his death.  He was listening to my advice but seemed unmoved.  I have explained to him that it would be of critical importance for him to enroll himself into a rehabilitation program and he voiced understanding.  He does not seem to be motivated to do so however.  Assessment & Plan:  Recreational OD on multiple illicit drugs - Polysubstance abuse  No evidence of suicidal intention -counseled patient on absolute need to discontinue substances of abuse - connect with all available resources as he will allow  SIRS w/ hypotension  SIRS physiology has resolved - BP stable   Transaminitis  Possibly related to APAP OD (possible injection of crushed Percocet) - Poison Control suggested stopping Acetadote 1/6 -LFTs trending up today therefore will delay discharge, resume hydration, and follow-up in a.m.  Recent Labs  Lab 06/05/18 2052 06/06/18 0902 06/07/18 0319 06/08/18 0309  AST 652* 403* 432* 713*  ALT 1,605* 1,242* 1,127* 1,279*  ALKPHOS 159* 85 103 121  BILITOT 3.4* 2.1* 1.2 1.1  PROT 6.6 5.8* 5.6* 5.8*  ALBUMIN 3.3* 2.7* 2.6* 2.7*    Acute kidney injury  Renal function has rapidly recovered  Recent Labs  Lab 06/05/18 2045 06/06/18 0902 06/07/18 0319 06/08/18 0309    CREATININE 1.56* 1.07 0.92 1.01    Severe Hypomagnesemia Corrected  Hypokalemia  Corrected   DVT prophylaxis: lovenox  Code Status: FULL CODE Family Communication: No family present at time of exam today Disposition Plan: Delay discharge due to climbing LFTs  Consultants:  none  Antimicrobials:  Rocephin Vanc   Objective: Blood pressure 125/85, pulse 67, temperature 98.1 F (36.7 C), temperature source Oral, resp. rate 12, height 5\' 10"  (1.778 m), weight 87 kg, SpO2 98 %.  Intake/Output Summary (Last 24 hours) at 06/08/2018 1508 Last data filed at 06/08/2018 1200 Gross per 24 hour  Intake 2120 ml  Output -  Net 2120 ml   Filed Weights   06/06/18 0132 06/06/18 0606  Weight: 83.9 kg 87 kg    Examination: General: No acute respiratory distress -alert and conversant Lungs: Clear to auscultation bilaterally -no wheezing Cardiovascular: Regular rate and rhythm -no murmur Abdomen: Nontender, nondistended, soft, bowel sounds positive, no rebound, no ascites, no appreciable mass Extremities: No edema bilateral lower extremities   CBC: Recent Labs  Lab 06/05/18 2045 06/06/18 1000 06/07/18 0319 06/08/18 0309  WBC 4.4 24.1* 13.9* 7.6  HGB 14.5 11.1* 12.3* 11.3*  HCT 44.2 33.9* 37.8* 35.0*  MCV 84.5 86.7 86.9 86.2  PLT 221 161 151 160   Basic Metabolic Panel: Recent Labs  Lab 06/05/18 2045 06/05/18 2303 06/06/18 0902 06/07/18 0319 06/08/18 0309  NA 132*  --  141 141 140  K 3.0*  --  4.2 4.0  3.6  CL 102  --  117* 116* 113*  CO2 18*  --  16* 20* 20*  GLUCOSE 119*  --  76 94 90  BUN 17  --  15 10 8   CREATININE 1.56*  --  1.07 0.92 1.01  CALCIUM 8.3*  --  7.6* 8.0* 8.4*  MG  --  0.9* 1.6* 2.3  --    GFR: Estimated Creatinine Clearance: 109.4 mL/min (by C-G formula based on SCr of 1.01 mg/dL).  Liver Function Tests: Recent Labs  Lab 06/05/18 2052 06/06/18 0902 06/07/18 0319 06/08/18 0309  AST 652* 403* 432* 713*  ALT 1,605* 1,242* 1,127* 1,279*   ALKPHOS 159* 85 103 121  BILITOT 3.4* 2.1* 1.2 1.1  PROT 6.6 5.8* 5.6* 5.8*  ALBUMIN 3.3* 2.7* 2.6* 2.7*    Coagulation Profile: Recent Labs  Lab 06/05/18 2303 06/07/18 0319 06/08/18 0309  INR 1.42 1.20 0.99    Cardiac Enzymes: Recent Labs  Lab 06/05/18 2352  CKTOTAL 228     Recent Results (from the past 240 hour(s))  Urine culture     Status: None   Collection Time: 06/06/18 12:37 AM  Result Value Ref Range Status   Specimen Description URINE, RANDOM  Final   Special Requests Normal  Final   Culture   Final    NO GROWTH Performed at [facility], [address] [place], [place] [address]    Report Status 06/07/2018 FINAL  Final  Blood culture (routine x 2)     Status: None (Preliminary result)   Collection Time: 06/06/18  1:03 AM  Result Value Ref Range Status   Specimen Description BLOOD LEFT HAND  Final   Special Requests   Final    BOTTLES DRAWN AEROBIC AND ANAEROBIC Blood Culture results may not be optimal due to an inadequate volume of blood received in culture bottles   Culture   Final    NO GROWTH 2 DAYS Performed at [facility], [address] [place], [place] [address]    Report Status PENDING  Incomplete  Culture, blood (Routine X 2) w Reflex to ID Panel     Status: None (Preliminary result)   Collection Time: 06/06/18  1:25 AM  Result Value Ref Range Status   Specimen Description BLOOD RIGHT ANTECUBITAL  Final   Special Requests   Final    BOTTLES DRAWN AEROBIC AND ANAEROBIC Blood Culture results may not be optimal due to an inadequate volume of blood received in culture bottles   Culture   Final    NO GROWTH 2 DAYS Performed at [facility], [address] [place], [place] [address]    Report Status PENDING  Incomplete  Respiratory Panel by PCR     Status: None   Collection Time: 06/06/18  1:52 AM  Result Value Ref Range Status   Adenovirus NOT DETECTED NOT DETECTED Final   Coronavirus 229E NOT DETECTED NOT DETECTED  Final   Coronavirus HKU1 NOT DETECTED NOT DETECTED Final   Coronavirus NL63 NOT DETECTED NOT DETECTED Final   Coronavirus OC43 NOT DETECTED NOT DETECTED Final   Metapneumovirus NOT DETECTED NOT DETECTED Final   Rhinovirus / Enterovirus NOT DETECTED NOT DETECTED Final   Influenza A NOT DETECTED NOT DETECTED Final   Influenza B NOT DETECTED NOT DETECTED Final   Parainfluenza Virus 1 NOT DETECTED NOT DETECTED Final   Parainfluenza Virus 2 NOT DETECTED NOT DETECTED Final   Parainfluenza Virus 3 NOT DETECTED NOT DETECTED Final  Parainfluenza Virus 4 NOT DETECTED NOT DETECTED Final   Respiratory Syncytial Virus NOT DETECTED NOT DETECTED Final   Bordetella pertussis NOT DETECTED NOT DETECTED Final   Chlamydophila pneumoniae NOT DETECTED NOT DETECTED Final   Mycoplasma pneumoniae NOT DETECTED NOT DETECTED Final     Scheduled Meds: . enoxaparin (LOVENOX) injection  40 mg Subcutaneous Q24H  . folic acid  1 mg Oral Daily  . Influenza vac split quadrivalent PF  0.5 mL Intramuscular Tomorrow-1000  . multivitamin with minerals  1 tablet Oral Daily  . pneumococcal 23 valent vaccine  0.5 mL Intramuscular Tomorrow-1000  . thiamine  100 mg Oral Daily   Continuous Infusions: . sodium chloride 100 mL/hr at 06/08/18 1050     LOS: 2 days    Lonia BloodJeffrey T. McClung, MD [facility] Office  [phone] Pager - Text Page per Loretha StaplerAmion as per below:  On-Call/Text Page:      [email or web address]  If 7PM-7AM, please contact night-coverage [email or web address] 06/08/2018, 3:08 PM

## 2018-06-09 ENCOUNTER — Encounter (HOSPITAL_COMMUNITY): Payer: Self-pay | Admitting: General Practice

## 2018-06-09 DIAGNOSIS — F1910 Other psychoactive substance abuse, uncomplicated: Secondary | ICD-10-CM

## 2018-06-09 LAB — CBC
HCT: 37.6 % — ABNORMAL LOW (ref 39.0–52.0)
Hemoglobin: 12.5 g/dL — ABNORMAL LOW (ref 13.0–17.0)
MCH: 28.9 pg (ref 26.0–34.0)
MCHC: 33.2 g/dL (ref 30.0–36.0)
MCV: 87 fL (ref 80.0–100.0)
Platelets: 187 10*3/uL (ref 150–400)
RBC: 4.32 MIL/uL (ref 4.22–5.81)
RDW: 13.6 % (ref 11.5–15.5)
WBC: 6.8 10*3/uL (ref 4.0–10.5)
nRBC: 0 % (ref 0.0–0.2)

## 2018-06-09 LAB — COMPREHENSIVE METABOLIC PANEL
ALT: 1483 U/L — AB (ref 0–44)
AST: 732 U/L — ABNORMAL HIGH (ref 15–41)
Albumin: 2.9 g/dL — ABNORMAL LOW (ref 3.5–5.0)
Alkaline Phosphatase: 134 U/L — ABNORMAL HIGH (ref 38–126)
Anion gap: 7 (ref 5–15)
BUN: 9 mg/dL (ref 6–20)
CO2: 22 mmol/L (ref 22–32)
Calcium: 8.5 mg/dL — ABNORMAL LOW (ref 8.9–10.3)
Chloride: 111 mmol/L (ref 98–111)
Creatinine, Ser: 0.85 mg/dL (ref 0.61–1.24)
GFR calc Af Amer: >60 mL/min (ref >60)
GLUCOSE: 82 mg/dL (ref 70–99)
Potassium: 3.4 mmol/L — ABNORMAL LOW (ref 3.5–5.1)
Sodium: 140 mmol/L (ref 135–145)
Total Bilirubin: 1.2 mg/dL (ref 0.3–1.2)
Total Protein: 6.1 g/dL — ABNORMAL LOW (ref 6.5–8.1)

## 2018-06-09 LAB — CK: Total CK: 25 U/L — ABNORMAL LOW (ref 49–397)

## 2018-06-09 LAB — MAGNESIUM: Magnesium: 1.9 mg/dL (ref 1.7–2.4)

## 2018-06-09 MED ORDER — POTASSIUM CHLORIDE CRYS ER 20 MEQ PO TBCR
40.0000 meq | EXTENDED_RELEASE_TABLET | Freq: Once | ORAL | Status: AC
  Administered 2018-06-09: 40 meq via ORAL
  Filled 2018-06-09: qty 2

## 2018-06-09 NOTE — Progress Notes (Signed)
[facility]                                                                              Patient Demographics  Travis Zimmerman Caison, is a 33 y.o. male, DOB - 1985/11/24, ION:[id number]  Admit date - 06/05/2018   Admitting Physician Hillary BowJared M Gardner, DO  Outpatient Primary MD for the patient is No primary care provider on file.  Outpatient specialists:   LOS - 3  days   Medical records reviewed and are as summarized below:    Chief Complaint  Patient presents with  . Drug Overdose       Brief summary   33yo M w/ a hx ofpolysubstance abuse who injected crushed oxycodone +/- percocet, smoked methamphetamine and THC, and drank EtOHall at the same time, then presented to the ED w/ severe palpitations and SOB.  Patient reported injecting 15 mg of crushed up OxyContin, had done this previously as well as chronic injection of meth, heroin and other substances. In the ED he was tachycardic to 160.    Assessment & Plan    Principal Problem:   OD (overdose of drug), undetermined intent, initial encounter with polysubstance abuse -Unclear about the suicidal and intention, will place psychiatry consult -Social work consult for all available resources for rehab  Active Problems: Acute transaminitis: Likely due to acute hepatitis B -LFTs trending up, AST elevated to 732, ALT 1483 -Acute hepatitis panel on 1/4 showed positive hepatitis B surface antigen with IgM, + hepatitis B core antibody, HCV antibody> 11.  HIV negative -CK level 25 -Ordered hep B e antigen, e antibody, hepatitis B DNA, surface antibody -RUQ US showed edematous gallbladder wall thickening with small amount of pericholecystic fluid, no gallstones, more consistent with systemic causes or underlying hepatic abnormalities such as hepatitis rather than primary gallbladder inflammation, no biliary dilatation. - INR 0.9 -Continue IV fluid hydration, discussed with the patient in detail, recommended to avoid sharing  needles -Discussed with GI, Dr. Loreta AveMann, no need of any treatment at this time, likely LFTs will trend up, plateau before they come down -There is no cirrhosis or liver decompensation on ultrasound.  Treatment will depend on Hep B e Ag, DNA, transaminitis if needed.  Dr. Loreta AveMann recommended outpatient GI follow-up, LFTs in 2 weeks    SIRS (systemic inflammatory response syndrome) (HCC) -Sirs physiology has resolved, BP stable    Hypokalemia -Replaced    Hypomagnesemia -Resolved    AKI (acute kidney injury) (HCC) Likely due to #1, presented with creatinine of 1.5, patient was placed on IV fluid hydration, Creatinine improved to 0.8  Code Status: Full CODE STATUS DVT Prophylaxis:  Lovenox Family Communication: Discussed in detail with the patient, all imaging results, lab results explained to the patient    Disposition Plan: Pending hepatitis B work-up, psych evaluation pending, will likely DC once LFTs start to trend down.    Time Spent in minutes 45 minutes  Procedures:  Right upper quadrant ultrasound  Consultants:   GI Psychiatry  Antimicrobials:   Anti-infectives (From admission, onward)   Start     Dose/Rate Route Frequency Ordered Stop   06/06/18 1000  vancomycin (  VANCOCIN) IVPB 1000 mg/200 mL premix  Status:  Discontinued     1,000 mg 200 mL/hr over 60 Minutes Intravenous Every 12 hours 06/06/18 0212 06/06/18 1717   06/06/18 0200  vancomycin (VANCOCIN) 1,500 mg in sodium chloride 0.9 % 500 mL IVPB     1,500 mg 250 mL/hr over 120 Minutes Intravenous  Once 06/06/18 0139 06/06/18 0530   06/06/18 0130  cefTRIAXone (ROCEPHIN) 2 g in sodium chloride 0.9 % 100 mL IVPB  Status:  Discontinued     2 g 200 mL/hr over 30 Minutes Intravenous Every 24 hours 06/06/18 0117 06/06/18 1717   06/06/18 0015  cefTRIAXone (ROCEPHIN) 1 g in sodium chloride 0.9 % 100 mL IVPB  Status:  Discontinued     1 g 200 mL/hr over 30 Minutes Intravenous  Once 06/06/18 0011 06/06/18 0134           Medications  Scheduled Meds: . enoxaparin (LOVENOX) injection  40 mg Subcutaneous Q24H  . folic acid  1 mg Oral Daily  . Influenza vac split quadrivalent PF  0.5 mL Intramuscular Tomorrow-1000  . multivitamin with minerals  1 tablet Oral Daily  . pneumococcal 23 valent vaccine  0.5 mL Intramuscular Tomorrow-1000  . thiamine  100 mg Oral Daily   Continuous Infusions: . sodium chloride 75 mL/hr at 06/08/18 2310   PRN Meds:.ibuprofen, ondansetron **OR** ondansetron (ZOFRAN) IV      Subjective:   Travis Zimmerman Saulsbury was seen and examined today.  Denies any specific complaints.  No nausea vomiting or abdominal pain.  No fevers patient denies dizziness, chest pain, shortness of breath, new weakness, numbess, tingling. No acute events overnight.    Objective:   Vitals:   06/08/18 1300 06/08/18 1700 06/08/18 2100 06/09/18 0452  BP:   124/79 121/72  Pulse:    (!) 57  Resp: 16 13 (!) 21 13  Temp:   97.7 F (36.5 C) 98 F (36.7 C)  TempSrc:   Oral Oral  SpO2:   98% 96%  Weight:      Height:        Intake/Output Summary (Last 24 hours) at 06/09/2018 1058 Last data filed at 06/08/2018 1630 Gross per 24 hour  Intake 1160 ml  Output -  Net 1160 ml     Wt Readings from Last 3 Encounters:  06/06/18 87 kg     Exam  General: Alert and oriented x 3, NAD  Eyes: PERRLA, EOMI, Anicteric Sclera,  HEENT:    Cardiovascular: S1 S2 auscultated, . Regular rate and rhythm.  Respiratory: Clear to auscultation bilaterally, no wheezing, rales or rhonchi  Gastrointestinal: Soft, nontender, nondistended, + bowel sounds  Ext: no pedal edema bilaterally  Neuro: No new deficits  Musculoskeletal: No digital cyanosis, clubbing  Skin: No rashes  Psych: Normal affect and demeanor, alert and oriented x3    Data Reviewed:  I have personally reviewed following labs and imaging studies  Micro Results Recent Results (from the past 240 hour(s))  Urine culture     Status: Travis Zimmerman    Collection Time: 06/06/18 12:37 AM  Result Value Ref Range Status   Specimen Description URINE, RANDOM  Final   Special Requests Normal  Final   Culture   Final    NO GROWTH Performed at [facility], [address] [place], [place] [address]    Report Status 06/07/2018 FINAL  Final  Blood culture (routine x 2)     Status: Travis Zimmerman (Preliminary result)   Collection Time: 06/06/18  1:03 AM  Result Value Ref Range Status   Specimen Description BLOOD LEFT HAND  Final   Special Requests   Final    BOTTLES DRAWN AEROBIC AND ANAEROBIC Blood Culture results may not be optimal due to an inadequate volume of blood received in culture bottles   Culture   Final    NO GROWTH 3 DAYS Performed at [facility], [address] [place], [place] [address]    Report Status PENDING  Incomplete  Culture, blood (Routine X 2) w Reflex to ID Panel     Status: Travis Zimmerman (Preliminary result)   Collection Time: 06/06/18  1:25 AM  Result Value Ref Range Status   Specimen Description BLOOD RIGHT ANTECUBITAL  Final   Special Requests   Final    BOTTLES DRAWN AEROBIC AND ANAEROBIC Blood Culture results may not be optimal due to an inadequate volume of blood received in culture bottles   Culture   Final    NO GROWTH 3 DAYS Performed at [facility], [address] [place], [place] [address]    Report Status PENDING  Incomplete  Respiratory Panel by PCR     Status: Travis Zimmerman   Collection Time: 06/06/18  1:52 AM  Result Value Ref Range Status   Adenovirus NOT DETECTED NOT DETECTED Final   Coronavirus 229E NOT DETECTED NOT DETECTED Final   Coronavirus HKU1 NOT DETECTED NOT DETECTED Final   Coronavirus NL63 NOT DETECTED NOT DETECTED Final   Coronavirus OC43 NOT DETECTED NOT DETECTED Final   Metapneumovirus NOT DETECTED NOT DETECTED Final   Rhinovirus / Enterovirus NOT DETECTED NOT DETECTED Final   Influenza A NOT DETECTED NOT DETECTED Final   Influenza B NOT DETECTED NOT DETECTED Final   Parainfluenza  Virus 1 NOT DETECTED NOT DETECTED Final   Parainfluenza Virus 2 NOT DETECTED NOT DETECTED Final   Parainfluenza Virus 3 NOT DETECTED NOT DETECTED Final   Parainfluenza Virus 4 NOT DETECTED NOT DETECTED Final   Respiratory Syncytial Virus NOT DETECTED NOT DETECTED Final   Bordetella pertussis NOT DETECTED NOT DETECTED Final   Chlamydophila pneumoniae NOT DETECTED NOT DETECTED Final   Mycoplasma pneumoniae NOT DETECTED NOT DETECTED Final    Radiology Reports Dg Chest Port 1 View  Result Date: 06/05/2018 CLINICAL DATA:  33 y/o  M; tachycardia. EXAM: PORTABLE CHEST 1 VIEW COMPARISON:  Travis Zimmerman. FINDINGS: The heart size and mediastinal contours are within normal limits. Both lungs are clear. The visualized skeletal structures are unremarkable. IMPRESSION: No active disease. Electronically Signed   By: Mitzi Hansen M.D.   On: 06/05/2018 21:20   US Abdomen Limited Ruq  Result Date: 06/06/2018 CLINICAL DATA:  Elevated LFTs. EXAM: ULTRASOUND ABDOMEN LIMITED RIGHT UPPER QUADRANT COMPARISON:  Travis Zimmerman. FINDINGS: Gallbladder: Physiologically distended. Diffuse edematous gallbladder wall thickening with wall thickness of 1.3 cm. No gallstones or sludge. Small amount pericholecystic fluid. No sonographic Murphy sign noted by sonographer. Common bile duct: Diameter: 3 mm, normal. Liver: No focal lesion identified. Within normal limits in parenchymal echogenicity. Portal vein is patent on color Doppler imaging with normal direction of blood flow towards the liver. IMPRESSION: 1. Edematous gallbladder wall thickening with small amount of pericholecystic fluid. No gallstones. The degree of wall thickening is more commonly seen with systemic causes or underlying hepatic abnormalities such as hepatitis rather than primary gallbladder inflammation. 2. No biliary dilatation. 3. Normal sonographic appearance of the liver. Electronically Signed   By: Narda Rutherford M.D.   On: 06/06/2018 02:52  Lab  Data:  CBC: Recent Labs  Lab 06/05/18 2045 06/06/18 1000 06/07/18 0319 06/08/18 0309 06/09/18 0325  WBC 4.4 24.1* 13.9* 7.6 6.8  HGB 14.5 11.1* 12.3* 11.3* 12.5*  HCT 44.2 33.9* 37.8* 35.0* 37.6*  MCV 84.5 86.7 86.9 86.2 87.0  PLT 221 161 151 160 187   Basic Metabolic Panel: Recent Labs  Lab 06/05/18 2045 06/05/18 2303 06/06/18 0902 06/07/18 0319 06/08/18 0309 06/09/18 0325  NA 132*  --  141 141 140 140  K 3.0*  --  4.2 4.0 3.6 3.4*  CL 102  --  117* 116* 113* 111  CO2 18*  --  16* 20* 20* 22  GLUCOSE 119*  --  76 94 90 82  BUN 17  --  15 10 8 9   CREATININE 1.56*  --  1.07 0.92 1.01 0.85  CALCIUM 8.3*  --  7.6* 8.0* 8.4* 8.5*  MG  --  0.9* 1.6* 2.3  --  1.9   GFR: Estimated Creatinine Clearance: 130 mL/min (by C-G formula based on SCr of 0.85 mg/dL). Liver Function Tests: Recent Labs  Lab 06/05/18 2052 06/06/18 0902 06/07/18 0319 06/08/18 0309 06/09/18 0325  AST 652* 403* 432* 713* 732*  ALT 1,605* 1,242* 1,127* 1,279* 1,483*  ALKPHOS 159* 85 103 121 134*  BILITOT 3.4* 2.1* 1.2 1.1 1.2  PROT 6.6 5.8* 5.6* 5.8* 6.1*  ALBUMIN 3.3* 2.7* 2.6* 2.7* 2.9*   No results for input(s): LIPASE, AMYLASE in the last 168 hours. No results for input(s): AMMONIA in the last 168 hours. Coagulation Profile: Recent Labs  Lab 06/05/18 2303 06/07/18 0319 06/08/18 0309  INR 1.42 1.20 0.99   Cardiac Enzymes: Recent Labs  Lab 06/05/18 2352 06/09/18 0750  CKTOTAL 228 25*   BNP (last 3 results) No results for input(s): PROBNP in the last 8760 hours. HbA1C: No results for input(s): HGBA1C in the last 72 hours. CBG: No results for input(s): GLUCAP in the last 168 hours. Lipid Profile: No results for input(s): CHOL, HDL, LDLCALC, TRIG, CHOLHDL, LDLDIRECT in the last 72 hours. Thyroid Function Tests: No results for input(s): TSH, T4TOTAL, FREET4, T3FREE, THYROIDAB in the last 72 hours. Anemia Panel: No results for input(s): VITAMINB12, FOLATE, FERRITIN, TIBC, IRON,  RETICCTPCT in the last 72 hours. Urine analysis:    Component Value Date/Time   COLORURINE YELLOW 06/05/2018 2350   APPEARANCEUR CLEAR 06/05/2018 2350   LABSPEC 1.006 06/05/2018 2350   PHURINE 5.0 06/05/2018 2350   GLUCOSEU NEGATIVE 06/05/2018 2350   HGBUR NEGATIVE 06/05/2018 2350   BILIRUBINUR NEGATIVE 06/05/2018 2350   KETONESUR NEGATIVE 06/05/2018 2350   PROTEINUR NEGATIVE 06/05/2018 2350   NITRITE NEGATIVE 06/05/2018 2350   LEUKOCYTESUR NEGATIVE 06/05/2018 2350     Danaka Llera M.D. [facility] 06/09/2018, 10:58 AM  Pager: [phone] Between 7am to 7pm - call Pager - [phone]  After 7pm go to [email or web address] - password [email or web address]  Call night coverage person covering after 7pm

## 2018-06-09 NOTE — Progress Notes (Signed)
Per Dr. Isidoro Donning, ok for patient to come off telemetry for a shower.

## 2018-06-09 NOTE — Consult Note (Signed)
[facility] Consult   Reason for Consult:  Drug overdose with multiple injectable drugs  Referring Physician:  Dr. Isidoro Donningai Patient Identification: Travis Zimmerman MRN:  [id number] Principal Diagnosis: Polysubstance abuse (HCC) Diagnosis:  Principal Problem:   OD (overdose of drug), undetermined intent, initial encounter Active Problems:   Transaminitis   SIRS (systemic inflammatory response syndrome) (HCC)   Polysubstance dependence including opioid type drug with complication, episodic abuse (HCC)   Hypokalemia   Hypomagnesemia   Hypotension   AKI (acute kidney injury) (HCC)   Total Time spent with patient: 1 hour  Subjective:   Travis LowJeremy Zimmerman is a 33 y.o. male patient admitted with drug overdose.  HPI:   Per chart review, patient was admitted with drug overdose of Oxycodone 15 mg. He crushed it and injected it. He also used IV methamphetamine, heroin and other substances. He was tachycardic to 160 in the ED. UDS was positive for amphetamines and THC. His hospital course has been complicated by SIRS with acute transaminitis (AST up to 732 and ALT up to 1483 today).   On interview, [name] reports denies trying to harm himself.  He reports problematic substance abuse for the past 3-4 years.  He reports, "I use anything I can get my hands on besides crack."  He used IV methamphetamine just prior to hospitalization.  He reports using IV Oxycodone a few days ago.  He used marijuana and drank 3-4 shots of alcohol to lessen the side effects he was having from IVDU.  He denies a history of DTs or seizures from alcohol withdrawal.  He reports a history of depression and anxiety sine elementary school although never been formerly diagnosed or received treatment.  He denies a history of manic symptoms (decreased need for sleep, increased energy, pressured speech or euphoria) but he reports that he was hyperactive as a child.  He denies problems with sleep or appetite.  He denies current SI, HI  AVH.  He denies history of suicide attempts.  Past Psychiatric History: Depression and anxiety.   Risk to Self:  None. Denies SI.  Risk to Others:  None. Denies HI.  Prior Inpatient Therapy:  Denies  Prior Outpatient Therapy:  Denies   Past Medical History:  Past Medical History:  Diagnosis Date  . Hepatitis C   . Polysubstance abuse (HCC)    History reviewed. No pertinent surgical history. Family History:  Family History  Problem Relation Age of Onset  . Anesthesia problems Neg Hx    Family Psychiatric  History: Mother-depression and attempted suicide multiple times. Social History:  Social History   Substance and Sexual Activity  Alcohol Use Yes     Social History   Substance and Sexual Activity  Drug Use Yes  . Types: IV    Social History   Socioeconomic History  . Marital status: Single    Spouse name: Not on file  . Number of children: Not on file  . Years of education: Not on file  . Highest education level: Not on file  Occupational History  . Not on file  Social Needs  . Financial resource strain: Not on file  . Food insecurity:    Worry: Not on file    Inability: Not on file  . Transportation needs:    Medical: Not on file    Non-medical: Not on file  Tobacco Use  . Smoking status: Current Every Day Smoker  . Smokeless tobacco: Never Used  Substance and Sexual Activity  . Alcohol  use: Yes  . Drug use: Yes    Types: IV  . Sexual activity: Not on file  Lifestyle  . Physical activity:    Days per week: Not on file    Minutes per session: Not on file  . Stress: Not on file  Relationships  . Social connections:    Talks on phone: Not on file    Gets together: Not on file    Attends religious service: Not on file    Active member of club or organization: Not on file    Attends meetings of clubs or organizations: Not on file    Relationship status: Not on file  Other Topics Concern  . Not on file  Social History Narrative  . Not on file    Additional Social History: He lives with a friend. He works sporadically in [employer] work when he is not using drugs.     Allergies:  No Known Allergies  Labs:  Results for orders placed or performed during the hospital encounter of 06/05/18 (from the past 48 hour(s))  Comprehensive metabolic panel     Status: Abnormal   Collection Time: 06/08/18  3:09 AM  Result Value Ref Range   Sodium 140 135 - 145 mmol/L   Potassium 3.6 3.5 - 5.1 mmol/L   Chloride 113 (H) 98 - 111 mmol/L   CO2 20 (L) 22 - 32 mmol/L   Glucose, Bld 90 70 - 99 mg/dL   BUN 8 6 - 20 mg/dL   Creatinine, Ser 4.00 0.61 - 1.24 mg/dL   Calcium 8.4 (L) 8.9 - 10.3 mg/dL   Total Protein 5.8 (L) 6.5 - 8.1 g/dL   Albumin 2.7 (L) 3.5 - 5.0 g/dL   AST 867 (H) 15 - 41 U/L   ALT 1,279 (H) 0 - 44 U/L   Alkaline Phosphatase 121 38 - 126 U/L   Total Bilirubin 1.1 0.3 - 1.2 mg/dL   GFR calc non Af Amer >60 >60 mL/min   GFR calc Af Amer >60 >60 mL/min   Anion gap 7 5 - 15    Comment: Performed at [facility], [address], [place], [place] [address]  Protime-INR     Status: None   Collection Time: 06/08/18  3:09 AM  Result Value Ref Range   Prothrombin Time 13.0 11.4 - 15.2 seconds   INR 0.99     Comment: Performed at [facility], [address], [place], [place] [address]  CBC     Status: Abnormal   Collection Time: 06/08/18  3:09 AM  Result Value Ref Range   WBC 7.6 4.0 - 10.5 K/uL   RBC 4.06 (L) 4.22 - 5.81 MIL/uL   Hemoglobin 11.3 (L) 13.0 - 17.0 g/dL   HCT 12.4 (L) 58.0 - 99.8 %   MCV 86.2 80.0 - 100.0 fL   MCH 27.8 26.0 - 34.0 pg   MCHC 32.3 30.0 - 36.0 g/dL   RDW 33.8 25.0 - 53.9 %   Platelets 160 150 - 400 K/uL   nRBC 0.0 0.0 - 0.2 %    Comment: Performed at [facility], [address], [place], [place] [address]  Comprehensive metabolic panel     Status: Abnormal   Collection Time: 06/09/18  3:25 AM  Result Value Ref Range   Sodium 140 135 - 145 mmol/L   Potassium 3.4 (L) 3.5 -  5.1 mmol/L   Chloride 111 98 - 111 mmol/L   CO2 22  22 - 32 mmol/L   Glucose, Bld 82 70 - 99 mg/dL   BUN 9 6 - 20 mg/dL   Creatinine, Ser 4.090.85 0.61 - 1.24 mg/dL   Calcium 8.5 (L) 8.9 - 10.3 mg/dL   Total Protein 6.1 (L) 6.5 - 8.1 g/dL   Albumin 2.9 (L) 3.5 - 5.0 g/dL   AST 811732 (H) 15 - 41 U/L   ALT 1,483 (H) 0 - 44 U/L   Alkaline Phosphatase 134 (H) 38 - 126 U/L   Total Bilirubin 1.2 0.3 - 1.2 mg/dL   GFR calc non Af Amer >60 >60 mL/min   GFR calc Af Amer >60 >60 mL/min   Anion gap 7 5 - 15    Comment: Performed at [facility], [address] [place], [place] [address]  CBC     Status: Abnormal   Collection Time: 06/09/18  3:25 AM  Result Value Ref Range   WBC 6.8 4.0 - 10.5 K/uL   RBC 4.32 4.22 - 5.81 MIL/uL   Hemoglobin 12.5 (L) 13.0 - 17.0 g/dL   HCT 29.537.6 (L) 62.139.0 - 30.852.0 %   MCV 87.0 80.0 - 100.0 fL   MCH 28.9 26.0 - 34.0 pg   MCHC 33.2 30.0 - 36.0 g/dL   RDW 65.713.6 84.611.5 - 96.215.5 %   Platelets 187 150 - 400 K/uL   nRBC 0.0 0.0 - 0.2 %    Comment: Performed at [facility], [address] [place], [place] [address]  Magnesium     Status: None   Collection Time: 06/09/18  3:25 AM  Result Value Ref Range   Magnesium 1.9 1.7 - 2.4 mg/dL    Comment: Performed at [facility], [address] [place], [place] [address]  CK     Status: Abnormal   Collection Time: 06/09/18  7:50 AM  Result Value Ref Range   Total CK 25 (L) 49 - 397 U/L    Comment: Performed at [facility], [address] [place], [place] [address]    Current Facility-Administered Medications  Medication Dose Route Frequency Provider Last Rate Last Dose  . 0.9 %  sodium chloride infusion   Intravenous Continuous Lonia BloodMcClung, Jeffrey T, MD 75 mL/hr at 06/08/18 2310    . enoxaparin (LOVENOX) injection 40 mg  40 mg Subcutaneous Q24H Lyda PeroneGardner, Jared M, DO   40 mg at 06/09/18 [id number]  . folic acid (FOLVITE) tablet 1 mg  1 mg Oral Daily Lyda PeroneGardner, Jared M, DO   1 mg at 06/09/18 [id number]  . ibuprofen  (ADVIL,MOTRIN) tablet 200-400 mg  200-400 mg Oral Q6H PRN Lonia BloodMcClung, Jeffrey T, MD   400 mg at 06/08/18 1813  . Influenza vac split quadrivalent PF (FLUARIX) injection 0.5 mL  0.5 mL Intramuscular Tomorrow-1000 Lonia BloodMcClung, Jeffrey T, MD      . multivitamin with minerals tablet 1 tablet  1 tablet Oral Daily Lyda PeroneGardner, Jared M, DO   1 tablet at 06/09/18 0941  . ondansetron (ZOFRAN) tablet 4 mg  4 mg Oral Q6H PRN Hillary BowGardner, Jared M, DO       Or  . ondansetron [facility](ZOFRAN) injection 4 mg  4 mg Intravenous Q6H PRN Hillary BowGardner, Jared M, DO      . pneumococcal 23 valent vaccine (PNU-IMMUNE) injection 0.5 mL  0.5 mL Intramuscular Tomorrow-1000 Jetty DuhamelMcClung, Jeffrey T, MD      . thiamine (VITAMIN B-1) tablet 100 mg  100 mg Oral Daily Lyda PeroneGardner, Jared M, DO   100  mg at 06/09/18 8657    Musculoskeletal: Strength & Muscle Tone: within normal limits Gait & Station: UTA since patient is lying in bed. Patient leans: N/A  Psychiatric Specialty Exam: Physical Exam  Nursing note and vitals reviewed. Constitutional: He is oriented to person, place, and time. He appears well-developed and well-nourished.  HENT:  Head: Normocephalic and atraumatic.  Neck: Normal range of motion.  Respiratory: Effort normal.  Musculoskeletal: Normal range of motion.  Neurological: He is alert and oriented to person, place, and time.  Psychiatric: He has a normal mood and affect. His speech is normal and behavior is normal. Judgment and thought content normal. Cognition and memory are normal.    Review of Systems  Respiratory: Positive for cough.   Cardiovascular: Positive for chest pain.  Gastrointestinal: Negative for abdominal pain, constipation, diarrhea, nausea and vomiting.  Psychiatric/Behavioral: Positive for depression and substance abuse. Negative for hallucinations and suicidal ideas. The patient is nervous/anxious. The patient does not have insomnia.   All other systems reviewed and are negative.   Blood pressure (!) 107/95, pulse  68, temperature 98.7 F (37.1 C), temperature source Oral, resp. rate 19, height 5\' 10"  (1.778 m), weight 87 kg, SpO2 100 %.Body mass index is 27.52 kg/m.  General Appearance: Fairly Groomed, young, Caucasian male, wearing a hospital gown with long hair who is lying in bed. NAD.   Eye Contact:  Good  Speech:  Clear and Coherent and Normal Rate  Volume:  Normal  Mood:  Anxious and Depressed  Affect:  Appropriate  Thought Process:  Goal Directed, Linear and Descriptions of Associations: Intact  Orientation:  Full (Time, Place, and Person)  Thought Content:  Logical  Suicidal Thoughts:  No  Homicidal Thoughts:  No  Memory:  Immediate;   Good Recent;   Good Remote;   Good  Judgement:  Fair  Insight:  Fair  Psychomotor Activity:  Normal  Concentration:  Concentration: Good and Attention Span: Good  Recall:  Good  Fund of Knowledge:  Good  Language:  Good  Akathisia:  No  Handed:  Right  AIMS (if indicated):   N/A  Assets:  Communication Skills Desire for Improvement Financial Resources/Insurance Housing Physical Health Social Support  ADL's:  Intact  Cognition:  WNL  Sleep:   Okay   Assessment: Sreekar Broyhill is a 33 y.o. male who was admitted with polydrug overdose. He denies a suicide attempt or history of suicide attempts. He admits to problematic substance use and has already received substance abuse resources from SW. He endorses a history of depression and anxiety and would like outpatient mental health resources. He denies SI, HI or AVH. He does not warrant inpatient psychiatric hospitalization at this time.   Treatment Plan Summary: -Consider Gabapentin 300 mg TID for alcohol use/anxiety.  -EKG reviewed and QTc 412 on 1/7. Please closely monitor when starting or increasing QTc prolonging agents.  -Please have SW provide resources for outpatient therapists and psychiatrists. Patient has already received resources for substance abuse treatment.  -Psychiatry will sign off on  patient at this time. Please consult psychiatry again as needed.    Disposition: No evidence of imminent risk to self or others at present.   Patient does not meet criteria for psychiatric inpatient admission.  Cherly Beach, DO 06/09/2018 12:34 PM

## 2018-06-09 NOTE — Clinical Social Work Note (Signed)
Clinical Social Work Assessment  Patient Details  Name: Travis Zimmerman MRN: [id number] Date of Birth: 1985/08/18  Date of referral:  06/08/18               Reason for consult:  Substance Use/ETOH Abuse                Permission sought to share information with:    Permission granted to share information::     Name::        Agency::     Relationship::     Contact Information:     Housing/Transportation Living arrangements for the past 2 months:  Single Family Home Source of Information:  Patient Patient Interpreter Needed:  None Criminal Activity/Legal Involvement Pertinent to Current Situation/Hospitalization:  No - Comment as needed Significant Relationships:  Parents Lives with:  Friends Do you feel safe going back to the place where you live?  Yes Need for family participation in patient care:  Yes (Comment)  Care giving concerns:  CSW received consult for substance use.    Social Worker assessment / plan:  CSW talked with the patient at bedside. Patient was alert and oriented. Patient states he has been using on and off for the past 3 years. Patient states his drug of choice is opioids and methamphetamine.  Patient denied using marijuana and states he drinks very little alcohol, if any.   Patient states he lives in [place] with some friends. He states he family lives in [place] and [place].   CSW discussed and provided the patient with inpatient and outpatient mental health and substance abuse resources in the community. Patient states he wanted to stop using. Patient states  that changing his environment would help. CSW reviewed with the patient resources in the community that based eligibility on his ability to pay. CSW encourage the patient to contact local agencies.   SBIRT was completed   Employment status:  Unemployed Insurance information:  Self Pay (Medicaid Pending) PT Recommendations:  Not assessed at this time Information / Referral to community resources:   Outpatient Substance Abuse Treatment Options, Residential Substance Abuse Treatment Options, Other (Comment Required), SBIRT(inpatient and out patient mental health and substance use resources)  Patient/Family's Response to care:  Patient was appreciative of CSW assistance.   Patient/Family's Understanding of and Emotional Response to Diagnosis, Current Treatment, and Prognosis: Patient expressed understanding of CSW role and discharge process as well as medical condition. No questions/concerns about plan or treatment at this time.   Emotional Assessment Appearance:  Appears older than stated age Attitude/Demeanor/Rapport:  Engaged Affect (typically observed):  Appropriate, Pleasant Orientation:  Oriented to Self, Oriented to Place, Oriented to  Time, Oriented to Situation Alcohol / Substance use:  Alcohol Use, Illicit Drugs Psych involvement (Current and /or in the community):  No (Comment)  Discharge Needs  Concerns to be addressed:  Mental Health Concerns, Substance Abuse Concerns Readmission within the last 30 days:  No Current discharge risk:  Substance Abuse Barriers to Discharge:  Continued Medical Work up   [employer], LCSWA 06/09/2018, 5:25 PM

## 2018-06-10 LAB — COMPREHENSIVE METABOLIC PANEL
ALT: 1372 U/L — ABNORMAL HIGH (ref 0–44)
AST: 506 U/L — AB (ref 15–41)
Albumin: 3.1 g/dL — ABNORMAL LOW (ref 3.5–5.0)
Alkaline Phosphatase: 124 U/L (ref 38–126)
Anion gap: 9 (ref 5–15)
BUN: 13 mg/dL (ref 6–20)
CO2: 21 mmol/L — AB (ref 22–32)
Calcium: 8.7 mg/dL — ABNORMAL LOW (ref 8.9–10.3)
Chloride: 108 mmol/L (ref 98–111)
Creatinine, Ser: 0.78 mg/dL (ref 0.61–1.24)
GFR calc Af Amer: >60 mL/min (ref >60)
GFR calc non Af Amer: >60 mL/min (ref >60)
GLUCOSE: 101 mg/dL — AB (ref 70–99)
Potassium: 4.1 mmol/L (ref 3.5–5.1)
Sodium: 138 mmol/L (ref 135–145)
Total Bilirubin: 1 mg/dL (ref 0.3–1.2)
Total Protein: 6.7 g/dL (ref 6.5–8.1)

## 2018-06-10 LAB — HEPATITIS B E ANTIGEN: Hep B E Ag: POSITIVE — AB

## 2018-06-10 LAB — HEPATITIS B DNA, ULTRAQUANTITATIVE, PCR
HBV DNA SERPL PCR-ACNC: 557000 IU/mL
HBV DNA SERPL PCR-LOG IU: 5.746 log10 IU/mL

## 2018-06-10 LAB — HEPATITIS PANEL, ACUTE
HCV Ab: >11 s/co ratio — ABNORMAL HIGH (ref 0.0–0.9)
Hep A IgM: NEGATIVE
Hep B C IgM: POSITIVE — AB
Hepatitis B Surface Ag: POSITIVE — AB

## 2018-06-10 LAB — HEPATITIS B SURFACE ANTIBODY,QUALITATIVE: Hep B S Ab: NONREACTIVE

## 2018-06-10 NOTE — Discharge Summary (Addendum)
Physician Discharge Summary  Travis Zimmerman MRN: [id number] DOB/AGE: 1985/09/21 33 y.o.  PCP: No primary care provider on file.   Admit date: 06/05/2018 Discharge date: 06/10/2018  Discharge Diagnoses:    Principal Problem:   Polysubstance abuse (HCC) Active Problems:   OD (overdose of drug), undetermined intent, initial encounter   Transaminitis   SIRS (systemic inflammatory response syndrome) (HCC)   Polysubstance dependence including opioid type drug with complication, episodic abuse (HCC)   Hypokalemia   Hypomagnesemia   Hypotension   AKI (acute kidney injury) (HCC)    Follow-up recommendations Follow-up with PCP in 3-5 days , including all  additional recommended appointments as below Follow-up CBC, CMP in 3-5 days Have requested case management to set up appointment with infectious disease prior to discharge       Allergies as of 06/10/2018   No Known Allergies     Medication List    You have not been prescribed any medications.      Discharge Condition:  Prognosis guarded because of IV drug use  Discharge Instructions Get Medicines reviewed and adjusted: Please take all your medications with you for your next visit with your Primary MD  Please request your Primary MD to go over all hospital tests and procedure/radiological results at the follow up, please ask your Primary MD to get all Hospital records sent to his/her office.  If you experience worsening of your admission symptoms, develop shortness of breath, life threatening emergency, suicidal or homicidal thoughts you must seek medical attention immediately by calling 911 or calling your MD immediately  if symptoms less severe.  You must read complete instructions/literature along with all the possible adverse reactions/side effects for all the Medicines you take and that have been prescribed to you. Take any new Medicines after you have completely understood and accpet all the possible adverse reactions/side  effects.   Do not drive when taking Pain medications.   Do not take more than prescribed Pain, Sleep and Anxiety Medications  Special Instructions: If you have smoked or chewed Tobacco  in the last 2 yrs please stop smoking, stop any regular Alcohol  and or any Recreational drug use.  Wear Seat belts while driving.  Please note  You were cared for by a hospitalist during your hospital stay. Once you are discharged, your primary care physician will handle any further medical issues. Please note that NO REFILLS for any discharge medications will be authorized once you are discharged, as it is imperative that you return to your primary care physician (or establish a relationship with a primary care physician if you do not have one) for your aftercare needs so that they can reassess your need for medications and monitor your lab values.     No Known Allergies    Disposition: Discharge disposition: 01-Home or Self Care        Consults:  GI psychiatry   Significant Diagnostic Studies:  Dg Chest Port 1 View  Result Date: 06/05/2018 CLINICAL DATA:  33 y/o  M; tachycardia. EXAM: PORTABLE CHEST 1 VIEW COMPARISON:  None. FINDINGS: The heart size and mediastinal contours are within normal limits. Both lungs are clear. The visualized skeletal structures are unremarkable. IMPRESSION: No active disease. Electronically Signed   By: Mitzi Hansen M.D.   On: 06/05/2018 21:20   US Abdomen Limited Ruq  Result Date: 06/06/2018 CLINICAL DATA:  Elevated LFTs. EXAM: ULTRASOUND ABDOMEN LIMITED RIGHT UPPER QUADRANT COMPARISON:  None. FINDINGS: Gallbladder: Physiologically distended. Diffuse edematous gallbladder wall thickening with  wall thickness of 1.3 cm. No gallstones or sludge. Small amount pericholecystic fluid. No sonographic Murphy sign noted by sonographer. Common bile duct: Diameter: 3 mm, normal. Liver: No focal lesion identified. Within normal limits in parenchymal echogenicity.  Portal vein is patent on color Doppler imaging with normal direction of blood flow towards the liver. IMPRESSION: 1. Edematous gallbladder wall thickening with small amount of pericholecystic fluid. No gallstones. The degree of wall thickening is more commonly seen with systemic causes or underlying hepatic abnormalities such as hepatitis rather than primary gallbladder inflammation. 2. No biliary dilatation. 3. Normal sonographic appearance of the liver. Electronically Signed   By: Narda Rutherford M.D.   On: 06/06/2018 02:52       Filed Weights   06/06/18 0132 06/06/18 0606  Weight: 83.9 kg 87 kg     Microbiology: Recent Results (from the past 240 hour(s))  Urine culture     Status: None   Collection Time: 06/06/18 12:37 AM  Result Value Ref Range Status   Specimen Description URINE, RANDOM  Final   Special Requests Normal  Final   Culture   Final    NO GROWTH Performed at [facility], [address] [place], [place] [address]    Report Status 06/07/2018 FINAL  Final  Blood culture (routine x 2)     Status: None (Preliminary result)   Collection Time: 06/06/18  1:03 AM  Result Value Ref Range Status   Specimen Description BLOOD LEFT HAND  Final   Special Requests   Final    BOTTLES DRAWN AEROBIC AND ANAEROBIC Blood Culture results may not be optimal due to an inadequate volume of blood received in culture bottles   Culture   Final    NO GROWTH 4 DAYS Performed at [facility], [address] [place], [place] [address]    Report Status PENDING  Incomplete  Culture, blood (Routine X 2) w Reflex to ID Panel     Status: None (Preliminary result)   Collection Time: 06/06/18  1:25 AM  Result Value Ref Range Status   Specimen Description BLOOD RIGHT ANTECUBITAL  Final   Special Requests   Final    BOTTLES DRAWN AEROBIC AND ANAEROBIC Blood Culture results may not be optimal due to an inadequate volume of blood received in culture bottles   Culture   Final    NO  GROWTH 4 DAYS Performed at [facility], [address], [place], [place] [address]    Report Status PENDING  Incomplete  Respiratory Panel by PCR     Status: None   Collection Time: 06/06/18  1:52 AM  Result Value Ref Range Status   Adenovirus NOT DETECTED NOT DETECTED Final   Coronavirus 229E NOT DETECTED NOT DETECTED Final   Coronavirus HKU1 NOT DETECTED NOT DETECTED Final   Coronavirus NL63 NOT DETECTED NOT DETECTED Final   Coronavirus OC43 NOT DETECTED NOT DETECTED Final   Metapneumovirus NOT DETECTED NOT DETECTED Final   Rhinovirus / Enterovirus NOT DETECTED NOT DETECTED Final   Influenza A NOT DETECTED NOT DETECTED Final   Influenza B NOT DETECTED NOT DETECTED Final   Parainfluenza Virus 1 NOT DETECTED NOT DETECTED Final   Parainfluenza Virus 2 NOT DETECTED NOT DETECTED Final   Parainfluenza Virus 3 NOT DETECTED NOT DETECTED Final   Parainfluenza Virus 4 NOT DETECTED NOT DETECTED Final   Respiratory Syncytial Virus NOT DETECTED NOT DETECTED Final   Bordetella pertussis NOT DETECTED NOT DETECTED Final  Chlamydophila pneumoniae NOT DETECTED NOT DETECTED Final   Mycoplasma pneumoniae NOT DETECTED NOT DETECTED Final       Blood Culture    Component Value Date/Time   SDES BLOOD RIGHT ANTECUBITAL 06/06/2018 0125   SPECREQUEST  06/06/2018 0125    BOTTLES DRAWN AEROBIC AND ANAEROBIC Blood Culture results may not be optimal due to an inadequate volume of blood received in culture bottles   CULT  06/06/2018 0125    NO GROWTH 4 DAYS Performed at [facility], [address] [place], [place] [address]    REPTSTATUS PENDING 06/06/2018 0125      Labs: Results for orders placed or performed during the hospital encounter of 06/05/18 (from the past 48 hour(s))  Comprehensive metabolic panel     Status: Abnormal   Collection Time: 06/09/18  3:25 AM  Result Value Ref Range   Sodium 140 135 - 145 mmol/L   Potassium 3.4 (L) 3.5 - 5.1 mmol/L   Chloride 111 98 - 111  mmol/L   CO2 22 22 - 32 mmol/L   Glucose, Bld 82 70 - 99 mg/dL   BUN 9 6 - 20 mg/dL   Creatinine, Ser 9.62 0.61 - 1.24 mg/dL   Calcium 8.5 (L) 8.9 - 10.3 mg/dL   Total Protein 6.1 (L) 6.5 - 8.1 g/dL   Albumin 2.9 (L) 3.5 - 5.0 g/dL   AST 952 (H) 15 - 41 U/L   ALT 1,483 (H) 0 - 44 U/L   Alkaline Phosphatase 134 (H) 38 - 126 U/L   Total Bilirubin 1.2 0.3 - 1.2 mg/dL   GFR calc non Af Amer >60 >60 mL/min   GFR calc Af Amer >60 >60 mL/min   Anion gap 7 5 - 15    Comment: Performed at [facility], [address], [place], [place] [address]  CBC     Status: Abnormal   Collection Time: 06/09/18  3:25 AM  Result Value Ref Range   WBC 6.8 4.0 - 10.5 K/uL   RBC 4.32 4.22 - 5.81 MIL/uL   Hemoglobin 12.5 (L) 13.0 - 17.0 g/dL   HCT 44.0 (L) 10.2 - 72.5 %   MCV 87.0 80.0 - 100.0 fL   MCH 28.9 26.0 - 34.0 pg   MCHC 33.2 30.0 - 36.0 g/dL   RDW 36.6 44.0 - 34.7 %   Platelets 187 150 - 400 K/uL   nRBC 0.0 0.0 - 0.2 %    Comment: Performed at [facility], [address] [place], [place] [address]  Hepatitis panel, acute     Status: Abnormal   Collection Time: 06/09/18  3:25 AM  Result Value Ref Range   Hepatitis B Surface Ag Positive (A) Negative    Comment: Positive HBsAg verified by algorithm coupled with screening index.   HCV Ab >11.0 (H) 0.0 - 0.9 s/co ratio    Comment: (NOTE)                                  Negative:     < 0.8                             Indeterminate: 0.8 - 0.9  Positive:     > 0.9 The CDC recommends that a positive HCV antibody result be followed up with a HCV Nucleic Acid Amplification test ([id number]). Performed At: [facility] [place] [address] [place], [place] [id number] Jolene SchimkeNagendra Sanjai MD BJ:[id number]:[phone]    Hep A IgM Negative Negative   Hep B C IgM Positive (A) Negative  Magnesium     Status: None   Collection Time: 06/09/18  3:25 AM  Result Value Ref Range   Magnesium 1.9 1.7 - 2.4 mg/dL    Comment:  Performed at [facility], [address] [place], [place] [address]  CK     Status: Abnormal   Collection Time: 06/09/18  7:50 AM  Result Value Ref Range   Total CK 25 (L) 49 - 397 U/L    Comment: Performed at [facility], [address] [place], [place] [address]  Hepatitis B e antigen     Status: Abnormal   Collection Time: 06/09/18  7:50 AM  Result Value Ref Range   Hep B E Ag Positive (A) Negative    Comment: (NOTE) Performed At: [facility] [place] [address] [place], [place] [id number] Jolene SchimkeNagendra Sanjai MD GM:[id number]:[phone]   Hepatitis B DNA, Ultraquantitative, PCR     Status: None   Collection Time: 06/09/18  7:50 AM  Result Value Ref Range   HBV DNA SERPL PCR-ACNC 557,000 IU/mL   HBV DNA SERPL PCR-LOG IU 5.746 log10 IU/mL   Test Info: Comment     Comment: (NOTE) The reportable range for this assay is 10 IU/mL to 1 billion IU/mL. Performed At: [facility] [place] [address] [place], [place] [id number] Jolene SchimkeNagendra Sanjai MD ZD:[id number]:[phone]   Hepatitis B surface antibody,qualitative     Status: None   Collection Time: 06/09/18  7:50 AM  Result Value Ref Range   Hep B S Ab Non Reactive     Comment: (NOTE)              Non Reactive: Inconsistent with immunity,                            less than 10 mIU/mL              Reactive:     Consistent with immunity,                            greater than 9.9 mIU/mL Performed At: [facility]  [address] [place], [place] [id number] Jolene SchimkeNagendra Sanjai MD AY:[id number]:[phone]   Comprehensive metabolic panel     Status: Abnormal   Collection Time: 06/10/18  2:36 AM  Result Value Ref Range   Sodium 138 135 - 145 mmol/L   Potassium 4.1 3.5 - 5.1 mmol/L   Chloride 108 98 - 111 mmol/L   CO2 21 (L) 22 - 32 mmol/L   Glucose, Bld 101 (H) 70 - 99 mg/dL   BUN 13 6 - 20 mg/dL   Creatinine, Ser 3.550.78 0.61 - 1.24 mg/dL   Calcium 8.7 (L) 8.9 - 10.3 mg/dL   Total Protein 6.7 6.5 - 8.1 g/dL   Albumin 3.1 (L) 3.5 - 5.0 g/dL    AST 732506 (H) 15 - 41 U/L   ALT 1,372 (H) 0 - 44 U/L   Alkaline Phosphatase 124 38 - 126 U/L   Total Bilirubin 1.0 0.3 - 1.2 mg/dL   GFR calc non Af Amer >  60 >60 mL/min   GFR calc Af Amer >60 >60 mL/min   Anion gap 9 5 - 15    Comment: Performed at [facility], [address], [place], [place] [address]     Lipid Panel  No results found for: CHOL, TRIG, HDL, CHOLHDL, VLDL, LDLCALC, LDLDIRECT   No results found for: HGBA1C   Lab Results  Component Value Date   CREATININE 0.78 06/10/2018     HPI :    Travis Zimmerman is a 33 y.o. male with medical history significant of polysubstance abuse.  Patient presents to ED for evaluation after injecting crushed oxycodone, smoking methamphetamine and THC, and drinking EtOH all earlier today.  Patient states that this caused him to have severe palpitations and some mild SOB.  No other significant PMH.  No recent fevers, vomiting, diarrhea, hematochezia, CP, abd pain.  No SI / HI.  No sick contacts.  No cough.   ED Course: Initially tachycardic to 160, has improved to sinus at 124 after multiple rounds of ativan.  BPs have remained 'soft' (80s systolic with MAP in the low 60s) despite 3L IVF bolus thus far in ED.  Tm 100.6.  CXR neg.  Patient put on empiric rocephin and vanc for possible bacteremia.admitted with drug overdose of Oxycodone 15 mg. He crushed it and injected it. He also used IV methamphetamine, heroin and other substances. He was tachycardic to 160 in the ED. UDS was positive for amphetamines and THC. His hospital course has been complicated by SIRS with acute transaminitis (AST up to 732 and ALT up to 1483 today).   HOSPITAL COURSE: *  OD (overdose of drug), undetermined intent, initial encounter with polysubstance abuse -Unclear about the suicidal and intention,   psychiatry consulted -Social work consult for all available resources for rehab  Patient declined to start gabapentin  He denies a suicide attempt or history of  suicide attempts. He admits to problematic substance use and has already received substance abuse resources from [facility]. He endorses a history of depression and anxiety and would like outpatient mental health resources    Acute transaminitis: Likely due to acute hepatitis B -LFTs trending up, AST elevated to 732, ALT 1483 -Acute hepatitis panel on 1/4 showed positive hepatitis B surface antigen with IgM: Antibody IgM, + hepatitis B core antibody IgM,positive hepatitis B E antigen. HBV DNA 557,000  HCV antibody> 11.  HIV negative -CK level 25 -RUQ US showed edematous gallbladder wall thickening with small amount of pericholecystic fluid, no gallstones, more consistent with systemic causes or underlying hepatic abnormalities such as hepatitis rather than primary gallbladder inflammation, no biliary dilatation. Low clinical suspicion for acute cholecystitis - INR 0.9 - discussed with the patient in detail, recommended to avoid sharing needles -Discussed with GI, Dr. Loreta Ave, no need of any treatment at this time, likely LFTs will trend up, plateau before they come down -There is no cirrhosis or liver decompensation on ultrasound.   Dr. Loreta Ave recommended outpatient GI follow-up, LFTs in 2 weeks I have also requested case management to set patient up with primary care provider as well as infectious disease prior to discharge Patient is insistent that his abdominal pain has improved. And he is requesting to be discharged today    SIRS (systemic inflammatory response syndrome) (HCC) -Sirs physiology has resolved, BP stable    Hypokalemia -Replaced    Hypomagnesemia -Resolved    AKI (acute kidney injury) (HCC) Likely due to #1, presented with creatinine of 1.5, patient  was placed on IV fluid hydration, Creatinine improved to 0.8   Discharge Exam * Blood pressure 118/69, pulse 66, temperature 97.6 F (36.4 C), temperature source Oral, resp. rate 12, height 5\' 10"  (1.778 m), weight 87 kg, SpO2 96  %.   Cardiovascular: S1 S2 auscultated, . Regular rate and rhythm.  Respiratory: Clear to auscultation bilaterally, no wheezing, rales or rhonchi  Gastrointestinal: Soft, nontender, nondistended, + bowel sounds  Ext: no pedal edema bilaterally  Neuro: No new deficits  Musculoskeletal: No digital cyanosis, clubbing      Signed: Richarda OverlieNayana Nancyjo Givhan 06/10/2018, 2:07 PM      Time needed to  prepare  discharge, discussed with the patient and family 35 minutes

## 2018-06-10 NOTE — Progress Notes (Signed)
Pt discharged home, per order. IV and telemetry box removed. AVS reviewed with pt. Pt instructed on the importance of going to his follow-up appointments with Infectious Disease and [facility]. Pt also instructed to call for follow-up with the GI doctor listed on his AVS. Pt verbalized understanding. Pt additionally instructed on importance of not doing drugs. Pt verbalized understanding. Pt discharged with all of his belongings. Pt left with a family member. Pt ambulated off the unit at time of discharge, per request.    Ardeen Jourdain BSN, RN

## 2018-06-11 LAB — CULTURE, BLOOD (ROUTINE X 2)
Culture: NO GROWTH
Culture: NO GROWTH

## 2018-06-11 LAB — HEPATITIS B E ANTIBODY: Hep B E Ab: NEGATIVE

## 2018-06-22 ENCOUNTER — Ambulatory Visit (INDEPENDENT_AMBULATORY_CARE_PROVIDER_SITE_OTHER): Payer: Self-pay | Admitting: Family

## 2018-06-22 ENCOUNTER — Encounter: Payer: Self-pay | Admitting: Family

## 2018-06-22 VITALS — BP 134/84 | HR 90 | Temp 98.5°F | Wt 201.0 lb

## 2018-06-22 DIAGNOSIS — R768 Other specified abnormal immunological findings in serum: Secondary | ICD-10-CM

## 2018-06-22 DIAGNOSIS — B169 Acute hepatitis B without delta-agent and without hepatic coma: Secondary | ICD-10-CM

## 2018-06-22 DIAGNOSIS — R7689 Other specified abnormal immunological findings in serum: Secondary | ICD-10-CM | POA: Insufficient documentation

## 2018-06-22 DIAGNOSIS — F1920 Other psychoactive substance dependence, uncomplicated: Secondary | ICD-10-CM

## 2018-06-22 NOTE — Assessment & Plan Note (Signed)
Currently sober and living with his grandmother. Not in counseling at present and feels as though he has things under control at present. Discussed importance of having a support system and appropriate coping mechanisms as they changes are not easy. Offered counseling service and currently declined. Continue to monitor.

## 2018-06-22 NOTE — Assessment & Plan Note (Signed)
Travis Zimmerman appears to have acute Hepatitis B with positive Hep B Surface Antigen, E antigen, and core ab IgM and viral DNA level of 557,000. Liver enzymes appear to be decreasing and will recheck Hepatitis B status today to determine need for treatment versus observation. Clinically he is stable with no current symptoms at present. We discussed the evaluations, transmission, and treatments of Hepatitis B if indicated. Plan for 3 month follow up office visit pending blood work.

## 2018-06-22 NOTE — Patient Instructions (Signed)
Nice to meet you.  We will check your blood work today.   Plan for following up in 3 months or sooner if needed pending blood work.   Hepatitis B Hepatitis B is a viral infection of the liver. There are two kinds of hepatitis B:  Acute hepatitis B. This lasts for six months or less.  Chronic hepatitis B. This lasts for more than six months. Chronic hepatitis B can lead to liver failure, scarring of the liver (cirrhosis), or liver cancer. Acute hepatitis B can turn into chronic hepatitis B. Most adults with acute hepatitis B do not develop chronic hepatitis B. Infants and young children who get hepatitis B are more likely to develop chronic hepatitis B than adults. The hepatitis B vaccine can prevent this condition. What are the causes? This condition is caused by the hepatitis B virus (HBV). The virus may spread from person to person (is contagious) through:  Blood.  Childbirth. A woman who has hepatitis B can pass it to her baby during birth.  Bodily fluids such as breast milk, tears, semen, vaginal fluids, and saliva. What increases the risk? The following factors may make you more likely to develop this condition:  Having contact with unclean (contaminated) needles or syringes. This may result from: ? Acupuncture. ? Tattooing. ? Body piercing. ? Injecting drugs.  Having unprotected sex with someone who is infected.  Living with or having close contact with a person who has hepatitis B.  Working in a job that involves contact with blood or bodily fluids, such as health care.  Traveling to a country that has many cases of hepatitis B.  Being on treatment to filter your blood (kidney dialysis).  Having a history of blood transfusions or organ transplants. What are the signs or symptoms? Symptoms of this condition may include:  Loss of appetite.  Fatigue.  Nausea.  Vomiting.  Stomach pain.  Dark yellow urine.  Yellowish skin and eyes  (jaundice).  Fever.  Light-colored or Ayer bowel movements.  Joint pain. In some cases, you may not have any symptoms. How is this diagnosed? This condition is diagnosed based on:  A physical exam.  Your medical history.  Blood tests. How is this treated? Treatment for chronic hepatitis B may include antiviral medicine. This medicine may help:  Lower your risk of liver failure, cirrhosis, or liver cancer.  Lower your ability to infect others with hepatitis B. You will need to avoid alcohol and medicines that can be hard for the liver to break down (metabolize). This helps prevent further injury to your liver. Follow these instructions at home: Medicines   Take over-the-counter and prescription medicines only as told by your health care provider.  Take your antiviral medicine as told by your health care provider. Do not stop taking the antiviral even if you start to feel better.  Do not take any over-the-counter medicines that contain acetaminophen.  Do not take any new medicines, including over-the-counter medicines for fever or pain, unless approved by your health care provider. Activity  Rest as needed.  Do not have sex unless approved by your health care provider.  Ask your health care provider when you may return to school or work. Eating and drinking  Eat a balanced diet with plenty of fruits and vegetables, whole grains, and lowfat (lean) meats or other non-meat proteins (such as beans or tofu).  Drink enough fluids to keep your urine clear or pale yellow.  Avoid alcohol. General instructions  Do not share  toothbrushes, nail clippers, or razors.  Wash your hands frequently with soap and water. If soap and water are not available, use hand sanitizer.  Keep all follow-up visits as told by your health care provider. This is important. How is this prevented?  Get the hepatitis B vaccine. This helps prevent the hepatitis B infection.  Wash your hands  frequently with soap and water. If soap and water are not available, use hand sanitizer.  Do not share needles or syringes.  Practice safe sex and use condoms.  Avoid handling blood or bodily fluids without gloves or other protection.  Avoid getting tattoos or piercings in shops or other locations that are not clean. Contact a health care provider if:  You develop a rash.  You develop jaundice, or your chronic jaundice becomes more severe.  You have a fever. Get help right away if:  You are unable to eat or drink.  You have a fever along with nausea or vomiting.  You feel confused.  You have trouble breathing.  Your skin, throat, mouth, or face becomes swollen.  You have jerky movements that you cannot control (seizure).  You become very sleepy or have trouble waking up.  Your stomach becomes very swollen. Summary  Hepatitis B is a viral infection of the liver. There are two kinds of hepatitis B: acute and chronic.  The hepatitis B virus (HBV) can be passed from person to person (is contagious).  You should not take any new medicines, including over-the-counter medicines for fever or pain, unless approved by your health care provider.  To help prevent hepatitis B, wash your hands frequently with soap and water. If soap and water are not available, use hand sanitizer. This information is not intended to replace advice given to you by your health care provider. Make sure you discuss any questions you have with your health care provider. Document Released: 05/16/2000 Document Revised: 06/24/2016 Document Reviewed: 06/24/2016 Elsevier Interactive Patient Education  2019 Else[employer] Inc[employer]   Hepatitis C  Hepatitis C is a viral infection of the liver. It can lead to scarring of the liver (cirrhosis), liver failure, or liver cancer. Hepatitis C may go undetected for months or years because people with the infection may not have symptoms, or they may have only mild  symptoms. What are the causes? This condition is caused by the hepatitis C virus (HCV). The virus can spread from person to person (is contagious) through:  Blood.  Childbirth. A woman who has hepatitis C can pass it to her baby during birth.  Bodily fluids, such as breast milk, tears, semen, vaginal fluids, and saliva.  Blood transfusions or organ transplants done in the [place] before 1992. What increases the risk? The following factors may make you more likely to develop this condition:  Having contact with unclean (contaminated) needles or syringes. This may result from: ? Acupuncture. ? Tattoing. ? Body piercing. ? Injecting drugs.  Having unprotected sex with someone who is infected.  Needing treatment to filter your blood (kidney dialysis).  Having HIV (human immunodeficiency virus) or AIDS (acquired immunodeficiency syndrome).  Working in a job that involves contact with blood or bodily fluids, such as health care. What are the signs or symptoms? Symptoms of this condition include:  Fatigue.  Loss of appetite.  Nausea.  Vomiting.  Abdominal pain.  Dark yellow urine.  Yellowish skin and eyes (jaundice).  Itchy skin.  Clay-colored bowel movements.  Joint pain.  Bleeding and bruising easily.  Fluid building up  in your stomach (ascites). In some cases, you may not have any symptoms. How is this diagnosed? This condition is diagnosed with:  Blood tests.  Other tests to check how well your liver is functioning. They may include: ? Magnetic resonance elastography (MRE). This imaging test uses MRIs and sound waves to measure liver stiffness. ? Transient elastography. This imaging test uses ultrasounds to measure liver stiffness. ? Liver biopsy. This test requires taking a small tissue sample from your liver to examine it under a microscope. How is this treated? Your health care provider may perform noninvasive tests or a liver biopsy to help  decide the best course of treatment. Treatment may include:  Antiviral medicines and other medicines.  Follow-up treatments every 6-12 months for infections or other liver conditions.  Receiving a donated liver (liver transplant). Follow these instructions at home: Medicines  Take over-the-counter and prescription medicines only as told by your health care provider.  Take your antiviral medicine as told by your health care provider. Do not stop taking the antiviral even if you start to feel better.  Do not take any medicines unless approved by your health care provider, including over-the-counter medicines and birth control pills. Activity  Rest as needed.  Do not have sex unless approved by your health care provider.  Ask your health care provider when you may return to school or work. Eating and drinking   Eat a balanced diet with plenty of fruits and vegetables, whole grains, and lowfat (lean) meats or non-meat proteins (such as beans or tofu).  Drink enough fluids to keep your urine clear or pale yellow.  Do not drink alcohol. General instructions  Do not share toothbrushes, nail clippers, or razors.  Wash your hands frequently with soap and water. If soap and water are not available, use hand sanitizer.  Cover any cuts or open sores on your skin to prevent spreading the virus.  Keep all follow-up visits as told by your health care provider. This is important. You may need follow-up visits every 6-12 months. How is this prevented? There is no vaccine for hepatitis C. The only way to prevent the disease is to reduce the risk of exposure to the virus. Make sure you:  Wash your hands frequently with soap and water. If soap and water are not available, use hand sanitizer.  Do not share needles or syringes.  Practice safe sex and use condoms.  Avoid handling blood or bodily fluids without gloves or other protection.  Avoid getting tattoos or piercings in shops or  other locations that are not clean. Contact a health care provider if:  You have a fever.  You develop abdominal pain.  You pass dark urine.  You pass clay-colored stools.  You develop joint pain. Get help right away if:  You have increasing fatigue or weakness.  You lose your appetite.  You cannot eat or drink without vomiting.  You develop jaundice or your jaundice gets worse.  You bruise or bleed easily. Summary  Hepatitis C is a viral infection of the liver. It can lead to scarring of the liver (cirrhosis), liver failure, or liver cancer.  The hepatitis C virus (HCV) causes this condition. The virus can pass from person to person (is contagious).  You should not take any medicines unless approved by your health care provider. This includes over-the-counter medicines and birth control pills. This information is not intended to replace advice given to you by your health care provider. Make sure you discuss  any questions you have with your health care provider. Document Released: 05/16/2000 Document Revised: 06/24/2016 Document Reviewed: 06/24/2016 Elsevier Interactive Patient Education  2019 [employer].

## 2018-06-22 NOTE — Progress Notes (Signed)
Subjective:    Patient ID: Travis Zimmerman, male    DOB: Oct 08, 1985, 33 y.o.   MRN: [id number]  Chief Complaint  Patient presents with  . Hepatitis C  . Hepatitis B    HPI:  Travis Zimmerman is a 33 y.o. male who presents today for initial office visit following hospitalization for Hepatitis B infection and positive Hepatitis C antibody.  [name] has a previous medical history of polysubstance abuse and asthma who was admitted to the hospital following injection of crushed oxycodone, smoking methamphetamine and drinking alcohol earlier in the day. He was started on vancomycin and cefepime initially with concern for bacteremia, however blood cultures were found to be negative. He had transaminitis with an AST of 732 and ALT of 1483. Hepatitis blood work showed a positive Hepatitis B surface antigen, positive Hepatitis B E antigen, positive Hepatitis B Core Ab IgM and a viral DNA of 557,000. Imaging performed showed a edematous gallbladder wall thickening with no gallstones; wall thickening consistent with hepatitis; and no biliary dilation. There was no cirrhosis noted and GI indicated no treatments at the time. At discharge AST was 506 and ALT 1372. He was referred to ID for follow up. All hospital labs, records and imaging were reviewed in detail.   Mr. Travis Zimmerman has been staying with his grandparents since leaving the hospital and reports one episode of drinking 12 beers when he became upset, otherwise he has remained sober. He has had mild epigastric pain that is relieved when having a bowel movement. Denies nausea, vomiting, diarrhea, scleral icterus, ease of bleeding or jaundice. He has not had any blood transfusions in the past, tattoos, or sharing of razors/toothbrushes. Largest risk factor for acquiring Hepatitis B and C is injection drug use.     No Known Allergies    No outpatient medications prior to visit.   No facility-administered medications prior to visit.      Past Medical History:   Diagnosis Date  . AKI (acute kidney injury) ([place]) 06/06/2018   Archie Endo 06/06/2018  . Childhood asthma   . Daily headache    "all the time now because of my teeth" (06/09/2018)  . GERD (gastroesophageal reflux disease)   . Hepatitis B   . Hepatitis C   . Polysubstance abuse ([place])   . Polysubstance abuse ([place]) 06/06/2018      Past Surgical History:  Procedure Laterality Date  . NO PAST SURGERIES        Family History  Problem Relation Age of Onset  . Anesthesia problems Neg Hx       Social History   Socioeconomic History  . Marital status: Single    Spouse name: Not on file  . Number of children: Not on file  . Years of education: Not on file  . Highest education level: Not on file  Occupational History  . Not on file  Social Needs  . Financial resource strain: Not on file  . Food insecurity:    Worry: Not on file    Inability: Not on file  . Transportation needs:    Medical: Not on file    Non-medical: Not on file  Tobacco Use  . Smoking status: Current Every Day Smoker    Packs/day: 1.00    Years: 20.00    Pack years: 20.00    Types: Cigarettes  . Smokeless tobacco: Former Systems developer    Types: Snuff, Chew  Substance and Sexual Activity  . Alcohol use: Yes    Comment: 06/09/2018 "  nothing in years"; pt reports he had been drinking all day/notes 06/06/2018  . Drug use: Yes    Types: IV, Other-see comments    Comment: 06/09/2018 "I use qthing but crack;" drug use "2-3 times/wk"  . Sexual activity: Not Currently  Lifestyle  . Physical activity:    Days per week: Not on file    Minutes per session: Not on file  . Stress: Not on file  Relationships  . Social connections:    Talks on phone: Not on file    Gets together: Not on file    Attends religious service: Not on file    Active member of club or organization: Not on file    Attends meetings of clubs or organizations: Not on file    Relationship status: Not on file  . Intimate partner violence:    Fear of  current or ex partner: Not on file    Emotionally abused: Not on file    Physically abused: Not on file    Forced sexual activity: Not on file  Other Topics Concern  . Not on file  Social History Narrative  . Not on file      Review of Systems  Constitutional: Negative for chills, diaphoresis, fatigue and fever.  Respiratory: Negative for cough, chest tightness, shortness of breath and wheezing.   Cardiovascular: Negative for chest pain.  Gastrointestinal: Negative for abdominal distention, abdominal pain, constipation, diarrhea, nausea and vomiting.  Neurological: Negative for weakness and headaches.  Hematological: Does not bruise/bleed easily.       Objective:    BP 134/84   Pulse 90   Temp 98.5 F (36.9 C) (Oral)   Wt 201 lb (91.2 kg)   BMI 28.84 kg/m  Nursing note and vital signs reviewed.  Physical Exam Constitutional:      General: He is not in acute distress.    Appearance: He is well-developed.  Cardiovascular:     Rate and Rhythm: Normal rate and regular rhythm.     Heart sounds: Normal heart sounds. No murmur. No friction rub. No gallop.   Pulmonary:     Effort: Pulmonary effort is normal. No respiratory distress.     Breath sounds: Normal breath sounds. No wheezing or rales.  Chest:     Chest wall: No tenderness.  Abdominal:     General: Bowel sounds are normal. There is no distension.     Palpations: Abdomen is soft. There is no mass.     Tenderness: There is no abdominal tenderness. There is no guarding or rebound.  Skin:    General: Skin is warm and dry.  Neurological:     Mental Status: He is alert and oriented to person, place, and time.  Psychiatric:        Behavior: Behavior normal.        Thought Content: Thought content normal.        Judgment: Judgment normal.         Assessment & Plan:   Problem List Items Addressed This Visit      Digestive   Acute hepatitis B - Primary    [name] appears to have acute Hepatitis B with  positive Hep B Surface Antigen, E antigen, and core ab IgM and viral DNA level of 557,000. Liver enzymes appear to be decreasing and will recheck Hepatitis B status today to determine need for treatment versus observation. Clinically he is stable with no current symptoms at present. We discussed the evaluations, transmission, and treatments  of Hepatitis B if indicated. Plan for 3 month follow up office visit pending blood work.       Relevant Orders   Hepatitis C genotype   Hepatitis C RNA quantitative   Liver Fibrosis, FibroTest-ActiTest   Hepatitis B core antibody, IgM   Hepatitis B core antibody, total   Hepatitis B DNA, ultraquantitative, PCR   Hepatitis B e antigen   Hepatitis B surface antigen   Hepatitis B surface antibody,qualitative   CBC   Comp Met (CMET)     Other   Polysubstance dependence including opioid type drug with complication, episodic abuse ([place])    Currently sober and living with his grandmother. Not in counseling at present and feels as though he has things under control at present. Discussed importance of having a support system and appropriate coping mechanisms as they changes are not easy. Offered counseling service and currently declined. Continue to monitor.       Positive hepatitis C antibody test    Positive Hepatitis C antibody test with no viral load to determine if current infection is active or previously cleared. He has not received treatment in the past. Will check Hepatitis C blood work today and consider treatment options pending blood work results if indicated.       Relevant Orders   Hepatitis C genotype   Hepatitis C RNA quantitative   Liver Fibrosis, FibroTest-ActiTest   Hepatitis B core antibody, IgM   Hepatitis B core antibody, total   Hepatitis B DNA, ultraquantitative, PCR   Hepatitis B e antigen   Hepatitis B surface antigen   Hepatitis B surface antibody,qualitative   CBC   Comp Met (CMET)       Montarius Kitagawa does not currently  have medications on file.   Follow-up: Return in about 3 months (around 09/21/2018), or if symptoms worsen or fail to improve.    Terri Piedra, MSN, FNP-C Nurse Practitioner [facility] for Infectious [facility] Office phone: [phone] Pager: [place] number: [phone]

## 2018-06-22 NOTE — Assessment & Plan Note (Signed)
Positive Hepatitis C antibody test with no viral load to determine if current infection is active or previously cleared. He has not received treatment in the past. Will check Hepatitis C blood work today and consider treatment options pending blood work results if indicated.

## 2018-06-25 ENCOUNTER — Telehealth: Payer: Self-pay | Admitting: Family

## 2018-06-25 LAB — HEPATITIS C GENOTYPE: HCV Genotype: NOT DETECTED

## 2018-06-25 LAB — CBC
HEMATOCRIT: 42.6 % (ref 38.5–50.0)
Hemoglobin: 14.5 g/dL (ref 13.2–17.1)
MCH: 29.1 pg (ref 27.0–33.0)
MCHC: 34 g/dL (ref 32.0–36.0)
MCV: 85.4 fL (ref 80.0–100.0)
MPV: 10.9 fL (ref 7.5–12.5)
Platelets: 296 10*3/uL (ref 140–400)
RBC: 4.99 10*6/uL (ref 4.20–5.80)
RDW: 14.4 % (ref 11.0–15.0)
WBC: 6.6 10*3/uL (ref 3.8–10.8)

## 2018-06-25 LAB — COMPREHENSIVE METABOLIC PANEL
AG Ratio: 1.5 (calc) (ref 1.0–2.5)
ALT: 65 U/L — ABNORMAL HIGH (ref 9–46)
AST: 24 U/L (ref 10–40)
Albumin: 4.7 g/dL (ref 3.6–5.1)
Alkaline phosphatase (APISO): 65 U/L (ref 40–115)
BILIRUBIN TOTAL: 0.4 mg/dL (ref 0.2–1.2)
BUN: 13 mg/dL (ref 7–25)
CHLORIDE: 104 mmol/L (ref 98–110)
CO2: 25 mmol/L (ref 20–32)
Calcium: 9.8 mg/dL (ref 8.6–10.3)
Creat: 0.95 mg/dL (ref 0.60–1.35)
Globulin: 3.1 g/dL (calc) (ref 1.9–3.7)
Glucose, Bld: 91 mg/dL (ref 65–99)
Potassium: 4.4 mmol/L (ref 3.5–5.3)
Sodium: 140 mmol/L (ref 135–146)
Total Protein: 7.8 g/dL (ref 6.1–8.1)

## 2018-06-25 LAB — LIVER FIBROSIS, FIBROTEST-ACTITEST
ALT: 68 U/L — ABNORMAL HIGH (ref 9–46)
APOLIPOPROTEIN A1: 197 mg/dL — AB (ref 94–176)
Alpha-2-Macroglobulin: 153 mg/dL (ref 106–279)
BILIRUBIN: 0.5 mg/dL (ref 0.2–1.2)
Fibrosis Score: 0.08
GGT: 67 U/L (ref 3–90)
HAPTOGLOBIN: 107 mg/dL (ref 43–212)
Necroinflammat ACT Score: 0.33
Reference ID: 2819460

## 2018-06-25 LAB — HEPATITIS B CORE ANTIBODY, TOTAL: HEP B C TOTAL AB: REACTIVE — AB

## 2018-06-25 LAB — HEPATITIS B DNA, ULTRAQUANTITATIVE, PCR
Hepatitis B DNA (Calc): 3.87 Log IU/mL — ABNORMAL HIGH
Hepatitis B DNA: 7330 IU/mL — ABNORMAL HIGH

## 2018-06-25 LAB — HEPATITIS B E ANTIGEN: Hep B E Ag: NONREACTIVE

## 2018-06-25 LAB — HEPATITIS B CORE ANTIBODY, IGM: Hep B C IgM: REACTIVE — AB

## 2018-06-25 LAB — HEPATITIS C RNA QUANTITATIVE
HCV Quantitative Log: <1.18 Log IU/mL
HCV RNA, PCR, QN: <15 IU/mL

## 2018-06-25 LAB — HEPATITIS B SURFACE ANTIGEN: Hepatitis B Surface Ag: REACTIVE — AB

## 2018-06-25 LAB — HEPATITIS B SURFACE ANTIBODY,QUALITATIVE: HEP B S AB: NONREACTIVE

## 2018-06-25 NOTE — Telephone Encounter (Signed)
Spoke with Travis Zimmerman regarding his results. No treatment is necessary for Hepatitis C as he does not have a viral load present. Hepatitis B appears in immune active stage with E antigen negative and core positive. Viral load of 7,330. AST/ALT trending down. No treatment is indicated at this time and will recheck in 3 months or sooner if needed.

## 2018-06-30 ENCOUNTER — Other Ambulatory Visit: Payer: Self-pay

## 2018-06-30 ENCOUNTER — Ambulatory Visit: Payer: Self-pay | Admitting: Licensed Clinical Social Worker

## 2018-06-30 ENCOUNTER — Ambulatory Visit: Payer: Self-pay | Attending: Critical Care Medicine | Admitting: Critical Care Medicine

## 2018-06-30 ENCOUNTER — Encounter: Payer: Self-pay | Admitting: Critical Care Medicine

## 2018-06-30 VITALS — BP 130/89 | HR 89 | Temp 98.5°F | Resp 16 | Ht 70.0 in | Wt 209.0 lb

## 2018-06-30 DIAGNOSIS — F419 Anxiety disorder, unspecified: Secondary | ICD-10-CM

## 2018-06-30 DIAGNOSIS — K219 Gastro-esophageal reflux disease without esophagitis: Secondary | ICD-10-CM | POA: Insufficient documentation

## 2018-06-30 DIAGNOSIS — T50904A Poisoning by unspecified drugs, medicaments and biological substances, undetermined, initial encounter: Secondary | ICD-10-CM

## 2018-06-30 DIAGNOSIS — F1920 Other psychoactive substance dependence, uncomplicated: Secondary | ICD-10-CM

## 2018-06-30 DIAGNOSIS — F1120 Opioid dependence, uncomplicated: Secondary | ICD-10-CM | POA: Insufficient documentation

## 2018-06-30 DIAGNOSIS — T43621A Poisoning by amphetamines, accidental (unintentional), initial encounter: Secondary | ICD-10-CM | POA: Insufficient documentation

## 2018-06-30 DIAGNOSIS — F17210 Nicotine dependence, cigarettes, uncomplicated: Secondary | ICD-10-CM | POA: Insufficient documentation

## 2018-06-30 DIAGNOSIS — F19288 Other psychoactive substance dependence with other psychoactive substance-induced disorder: Secondary | ICD-10-CM

## 2018-06-30 DIAGNOSIS — B169 Acute hepatitis B without delta-agent and without hepatic coma: Secondary | ICD-10-CM

## 2018-06-30 DIAGNOSIS — T402X1A Poisoning by other opioids, accidental (unintentional), initial encounter: Secondary | ICD-10-CM | POA: Insufficient documentation

## 2018-06-30 DIAGNOSIS — Z8619 Personal history of other infectious and parasitic diseases: Secondary | ICD-10-CM | POA: Insufficient documentation

## 2018-06-30 DIAGNOSIS — N179 Acute kidney failure, unspecified: Secondary | ICD-10-CM | POA: Insufficient documentation

## 2018-06-30 DIAGNOSIS — F1910 Other psychoactive substance abuse, uncomplicated: Secondary | ICD-10-CM

## 2018-06-30 DIAGNOSIS — B1910 Unspecified viral hepatitis B without hepatic coma: Secondary | ICD-10-CM | POA: Insufficient documentation

## 2018-06-30 DIAGNOSIS — Z87898 Personal history of other specified conditions: Secondary | ICD-10-CM

## 2018-06-30 NOTE — BH Specialist Note (Addendum)
°  Integrated Behavioral Health Initial Visit  MRN: [id number] Name: Cassady Sanchezgarcia  Number of Integrated Behavioral Health Clinician visits:: 1/6 Session Start time: 11:25PM  Session End time: 11:50PM Total time: 25 MINUTES  Type of Service: Integrated Behavioral Health- Individual/Family Interpretor:No.    Warm Hand Off Completed.       SUBJECTIVE: Caedon Keniston is a 33 y.o. male accompanied by self Patient was referred by PCP Shan Levans for substance use. Patient reports the following symptoms/concerns: Pt repots opioid use, tobacco use, and occasional alcohol use. Pt reports no opoid use since 06/05/2018.  Duration of problem: 7 years; Severity of problem: moderate  OBJECTIVE: Mood: Anxious and Affect: Appropriate Risk of harm to self or others: No plan to harm self or others  LIFE CONTEXT: Family and Social: Reports currently residing with grandmother. Identifies daughter as protective factor and is excited about being able to spend time with daughter since sobriety.  School/Work: Currently unemployed. Reports taking a trip to [place] to work for a friend and will return in about one month. Self-Care: Denies current use of opioid use. Reports drinking 12 pack of beer since being in hospital. Pt reports that he is attempting to minimize tobacco use.  Life Changes: Pt is currently in recovery from opioid use and is also in need of employment.   GOALS ADDRESSED: Patient will: 1. Reduce symptoms of: anxiety and depression 2. Increase knowledge and/or ability of: coping skills and healthy habits  3. Demonstrate ability to: Increase healthy adjustment to current life circumstances  INTERVENTIONS: Interventions utilized: Motivational Interviewing, Mindfulness or Management consultant, Supportive Counseling and Link to [facility]  Standardized Assessments completed: GAD-7 and PHQ 2&9  ASSESSMENT: Patient currently experiencing depressive symptoms and is in recovery  for substance use. Pt reports opioid use history, alcohol use, and tobacco use. States using substances for 7 years.  Pt reports he has not used opioids since hospitalization. Reports using a 12 pack of beer since being hospitalized. Also reports he is trying to minimize tobacco use. Shared that he used opioids as a form of meditation/relaxation method and wants to learn other resources for relaxing. Reports that substance use was a way coping with depression. Pt shared that he has no interest in reusing opioids and is interest in recover program/therapy. MSW Intern provided pt with substance use/behavioral health resources. MSW intern engaged in relaxation training with and provided resources for meditation. Encouraged pt to develop healthy coping skills.   Pt denies current SI/HI. Reports history of SI but no current. Identifies daughter as protective factor and has recently been allowed to see his daughter since sobriety.   Patient may benefit from recovery program and psychotherapy. Pt going to [place] and plans to return in approximately one month. MSW intern plans to follow-up with pt's employment concerns upon return.   PLAN: 1. Follow up with behavioral health clinician on : MSW Intern encouraged pt to follow-up upon return from [place] 2. Behavioral recommendations: Engage in healthy coping skills, relaxation techniques, and mindful meditation. Encouraged pt to follow-up with substance use/behavioral health resources 3. Referral(s): Integrated Art gallery manager (In Clinic), Community Mental Health Services (LME/Outside Clinic) and Substance Abuse Program 4. "From scale of 1-10, how likely are you to follow plan?":   Lavonna Rua, MSW Intern 07/01/2018, 10:12AM

## 2018-06-30 NOTE — Assessment & Plan Note (Signed)
Unintentional overdose now has resolved

## 2018-06-30 NOTE — Assessment & Plan Note (Signed)
Acute kidney injury has now resolved  Latest basic metabolic panel showed normal renal function

## 2018-06-30 NOTE — Progress Notes (Deleted)
   Subjective:    Patient ID: Travis Zimmerman, male    DOB: December 08, 1985, 33 y.o.   MRN: [id number]  Opioids, meth amphetamine,  etoh THC  Injected tried,         Review of Systems     Objective:   Physical Exam        Assessment & Plan:

## 2018-06-30 NOTE — Progress Notes (Signed)
Hospital f/u -  No concerns for today Results on 1/21-

## 2018-06-30 NOTE — Progress Notes (Signed)
Subjective:    Patient ID: Travis Zimmerman, male    DOB: July 28, 1985, 33 y.o.   MRN: [id number]  This is a 33 year old male who was recently admitted for opioid and methamphetamine overdose.  The patient apparently began using opiates about 2 years ago.  He had been inhaling as well as injecting the opioids and also began recently using methamphetamines.  Also has been using marijuana and cocaine.  Initially started his opioid use by obtaining opine off from a friend and injecting that.  The patient was admitted and was in shock along with hypoxia and acute renal failure.  He was found to have acute elevations in his liver enzymes and subsequent found to have acute hepatitis B infection.  Apparently 1 of the times he injected most recently there was no opiate in the container but a solution that he injected anyway and this likely contain the hepatitis B virus.  The patient has been seen by infectious disease and follow-up post discharge and has improved his liver enzymes.  At this point treatment for hepatitis B is not indicated and is being monitored.  Renal function also had improved to baseline.  Patient has now been off opioids for about 4 weeks.  He is interested in considering some type of follow-up treatment.  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   The patient's email is [email or web address]      Past Medical History:  Diagnosis Date  . AKI (acute kidney injury) ([place]) 06/06/2018   Archie Endo 06/06/2018  . Childhood asthma   . Daily headache    "all the time now because of my teeth" (06/09/2018)  . GERD (gastroesophageal reflux disease)   . Hepatitis B   . Hepatitis C   . Polysubstance abuse ([place])   . Polysubstance abuse ([place]) 06/06/2018     Family History  Problem Relation Age of Onset  . Anesthesia problems Neg Hx      Social History   Socioeconomic History  . Marital  status: Single    Spouse name: Not on file  . Number of children: Not on file  . Years of education: Not on file  . Highest education level: Not on file  Occupational History  . Not on file  Social Needs  . Financial resource strain: Not on file  . Food insecurity:    Worry: Not on file    Inability: Not on file  . Transportation needs:    Medical: Not on file    Non-medical: Not on file  Tobacco Use  . Smoking status: Current Every Day Smoker    Packs/day: 1.00    Years: 20.00    Pack years: 20.00    Types: Cigarettes  . Smokeless tobacco: Former Systems developer    Types: Snuff, Chew  Substance and Sexual Activity  . Alcohol use: Not Currently    Comment: 06/09/2018 "nothing in years"; pt reports he had been drinking all day/notes 06/06/2018  . Drug use: Not Currently    Types: IV, Other-see comments    Comment: 06/09/2018 "I use qthing but crack;" drug use "2-3 times/wk"  . Sexual activity: Not Currently  Lifestyle  . Physical activity:    Days per week: Not on file    Minutes per session: Not on file  . Stress: Not on file  Relationships  . Social connections:    Talks on phone: Not on file  Gets together: Not on file    Attends religious service: Not on file    Active member of club or organization: Not on file    Attends meetings of clubs or organizations: Not on file    Relationship status: Not on file  . Intimate partner violence:    Fear of current or ex partner: Not on file    Emotionally abused: Not on file    Physically abused: Not on file    Forced sexual activity: Not on file  Other Topics Concern  . Not on file  Social History Narrative  . Not on file     No Known Allergies   No outpatient medications prior to visit.   No facility-administered medications prior to visit.      Review of Systems  Constitutional: Positive for fatigue.  HENT: Negative.   Eyes: Negative.   Respiratory: Negative.   Cardiovascular: Negative.   Genitourinary: Negative.     Skin: Negative.   Neurological: Positive for weakness.  Hematological: Negative.   Psychiatric/Behavioral: Positive for dysphoric mood and sleep disturbance. Negative for self-injury and suicidal ideas.       Objective:   Physical Exam Vitals:   06/30/18 1052  BP: 130/89  Pulse: 89  Resp: 16  Temp: 98.5 F (36.9 C)  TempSrc: Oral  SpO2: 98%  Weight: 209 lb (94.8 kg)  Height: '5\' 10"'  (1.778 m)    Gen: Pleasant, well-nourished, in no distress,  flat affect  ENT: No lesions,  mouth clear,  oropharynx clear, no postnasal drip  Neck: No JVD, no TMG, no carotid bruits  Lungs: No use of accessory muscles, no dullness to percussion, clear without rales or rhonchi  Cardiovascular: RRR, heart sounds normal, no murmur or gallops, no peripheral edema  Abdomen: soft and NT, no HSM,  BS normal  Musculoskeletal: No deformities, no cyanosis or clubbing  Neuro: alert, non focal  Skin: Warm, no lesions or rashes, track marks are seen in the left antecubital area of the left arm.  Also in the left hand as well.  No results found.  BMP Latest Ref Rng & Units 06/22/2018 06/10/2018 06/09/2018  Glucose 65 - 99 mg/dL 91 101(H) 82  BUN 7 - 25 mg/dL '13 13 9  ' Creatinine 0.60 - 1.35 mg/dL 0.95 0.78 0.85  BUN/Creat Ratio 6 - 22 (calc) NOT APPLICABLE - -  Sodium 094 - 146 mmol/L 140 138 140  Potassium 3.5 - 5.3 mmol/L 4.4 4.1 3.4(L)  Chloride 98 - 110 mmol/L 104 108 111  CO2 20 - 32 mmol/L 25 21(L) 22  Calcium 8.6 - 10.3 mg/dL 9.8 8.7(L) 8.5(L)   CBC Latest Ref Rng & Units 06/22/2018 06/09/2018 06/08/2018  WBC 3.8 - 10.8 Thousand/uL 6.6 6.8 7.6  Hemoglobin 13.2 - 17.1 g/dL 14.5 12.5(L) 11.3(L)  Hematocrit 38.5 - 50.0 % 42.6 37.6(L) 35.0(L)  Platelets 140 - 400 Thousand/uL 296 187 160  CXR NAD  Hepatic Function Latest Ref Rng & Units 06/22/2018 06/22/2018 06/10/2018  Total Protein 6.1 - 8.1 g/dL 7.8 - 6.7  Albumin 3.5 - 5.0 g/dL - - 3.1(L)  AST 10 - 40 U/L 24 - 506(H)  ALT 9 - 46 U/L 65(H) 68(H)  1,372(H)  Alk Phosphatase 38 - 126 U/L - - 124  Total Bilirubin 0.2 - 1.2 mg/dL 0.4 - 1.0  Bilirubin, Direct 0.0 - 0.2 mg/dL - - -    Lab Results  Component Value Date   HEPBSAB NON-REACTIVE 06/22/2018  Assessment & Plan:  I personally reviewed all images and lab data in the [facility] system as well as any outside material available during this office visit and agree with the  radiology impressions.   Acute hepatitis B Acute hepatitis B infection secondary to intravenous injection.  The patient's liver enzymes are rapidly improved.  Hepatitis B surface antibody is nonreactive.  The patient is being followed by infectious disease at this time no other specific therapy is being contemplated except for follow-up.  Patient is no longer using IV drugs at this time and is interested in therapy  Patient has not drinking alcohol at this time  Plan will be to follow-up with infectious disease in a 1 month period of time and to follow-up in this clinic as well in 1 month  Patient also has an outstanding gastroenterology appointment as well for his liver function note he had gallbladder wall thickening without gallstones     AKI (acute kidney injury) ([place]) Acute kidney injury has now resolved  Latest basic metabolic panel showed normal renal function  OD (overdose of drug), undetermined intent, initial encounter Unintentional overdose now has resolved  Polysubstance dependence including opioid type drug with complication, episodic abuse ([place]) Ongoing opioid use with dependence  The patient met with our clinical social worker today and is agreed to follow-up with a ADS for counseling   Cheskel was seen today for follow-up.  Diagnoses and all orders for this visit:  Acute hepatitis B  Polysubstance abuse ([place])  Polysubstance dependence including opioid type drug with complication, episodic abuse ([place])  OD (overdose of drug), undetermined intent, initial encounter  AKI (acute  kidney injury) ([facility])

## 2018-06-30 NOTE — Assessment & Plan Note (Signed)
Ongoing opioid use with dependence  The patient met with our clinical social worker today and is agreed to follow-up with a ADS for counseling

## 2018-06-30 NOTE — Assessment & Plan Note (Signed)
Acute hepatitis B infection secondary to intravenous injection.  The patient's liver enzymes are rapidly improved.  Hepatitis B surface antibody is nonreactive.  The patient is being followed by infectious disease at this time no other specific therapy is being contemplated except for follow-up.  Patient is no longer using IV drugs at this time and is interested in therapy  Patient has not drinking alcohol at this time  Plan will be to follow-up with infectious disease in a 1 month period of time and to follow-up in this clinic as well in 1 month  Patient also has an outstanding gastroenterology appointment as well for his liver function note he had gallbladder wall thickening without gallstones

## 2018-06-30 NOTE — Patient Instructions (Addendum)
Please follow-up with recommendations from our social worker who met with you today  Please keep your follow-up visit with infectious disease on hepatitis  Please consider keeping your appointment with gastroenterology on your liver function and gallbladder  Please return to see Dr. Joya Gaskins when you return from [place] in 1 month   No new medications were prescribed

## 2018-07-28 ENCOUNTER — Ambulatory Visit: Payer: Self-pay | Admitting: Critical Care Medicine

## 2018-09-21 ENCOUNTER — Ambulatory Visit: Payer: Self-pay | Admitting: Family

## 2019-06-22 IMAGING — US US ABDOMEN LIMITED
1 series · 14 of 25 positions shown · non-contrast
Comparison: None.

CLINICAL DATA: Elevated LFTs.

EXAM:
ULTRASOUND ABDOMEN LIMITED RIGHT UPPER QUADRANT

[Series 1: us abdomen limited · 14 of 48 slices shown]
[im 1/48]
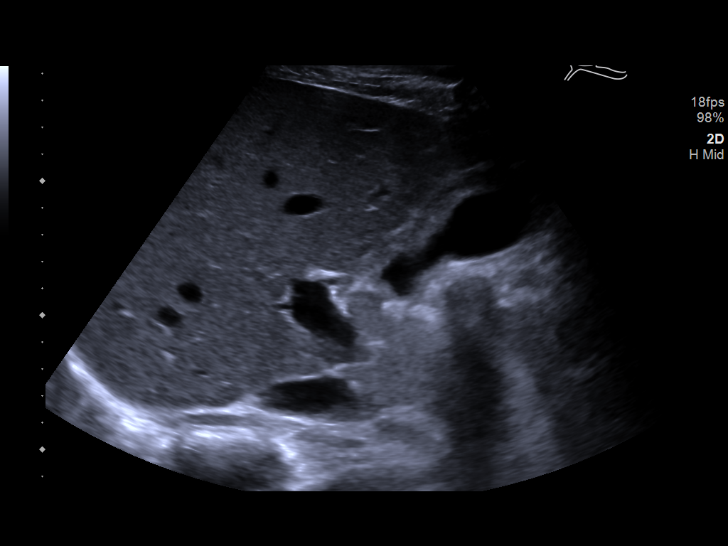
[im 4/48]
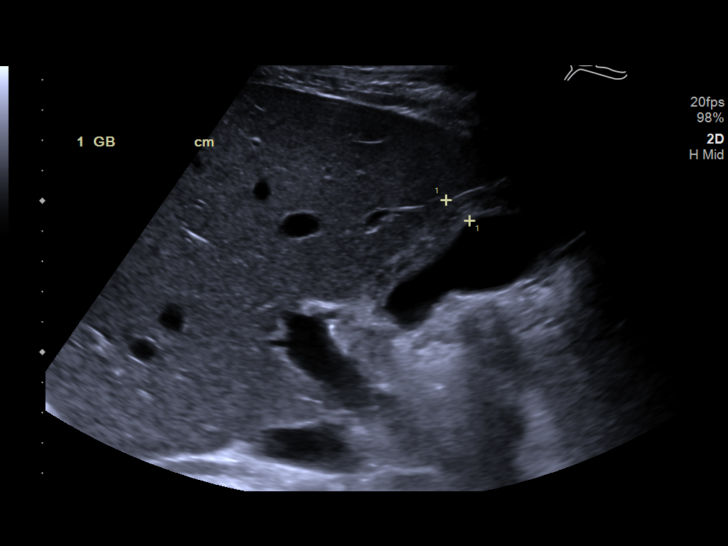
[im 8/48]
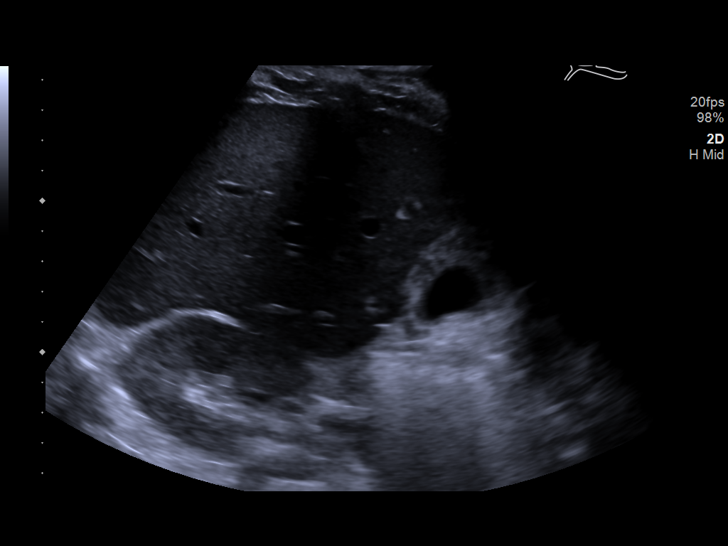
[im 12/48]
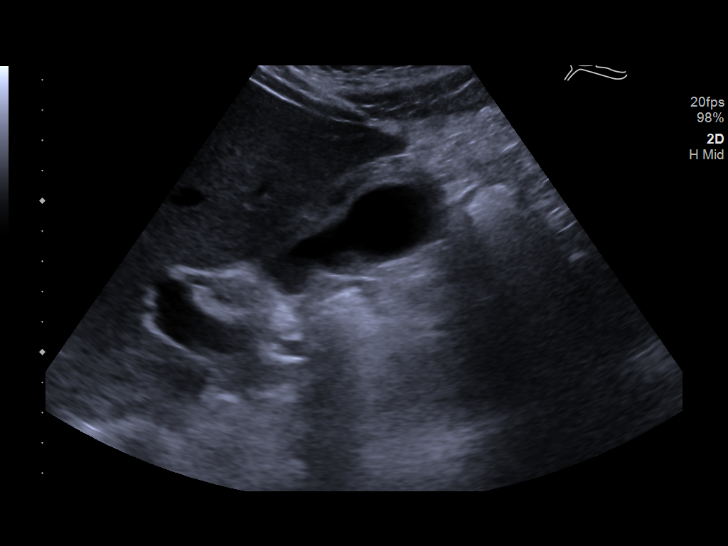
[im 16/48]
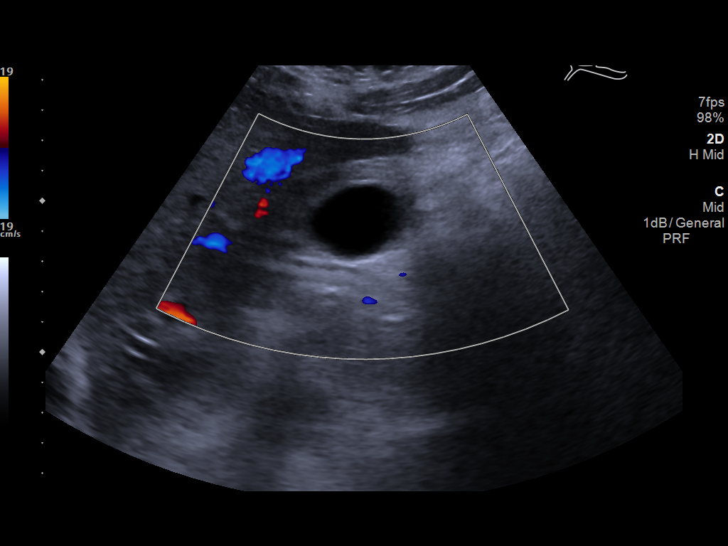
[im 18/48]
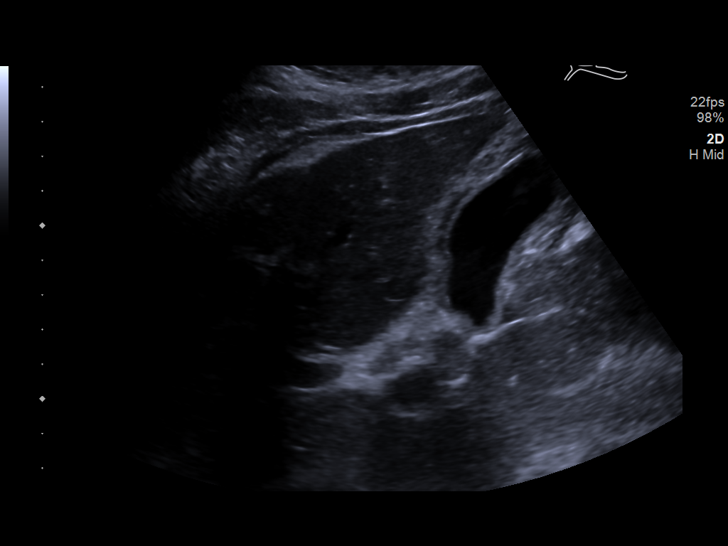
[im 22/48]
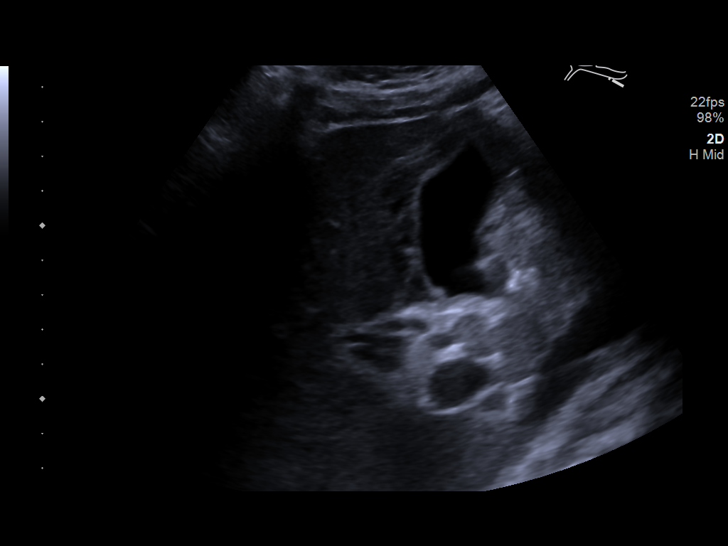
[im 26/48]
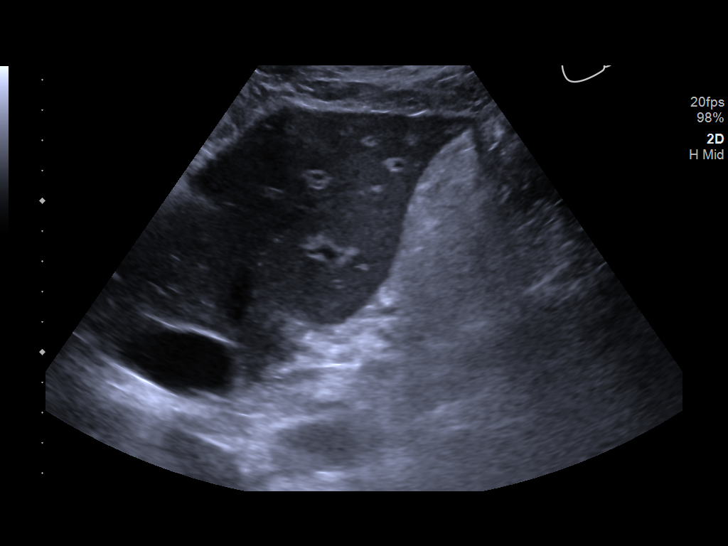
[im 30/48]
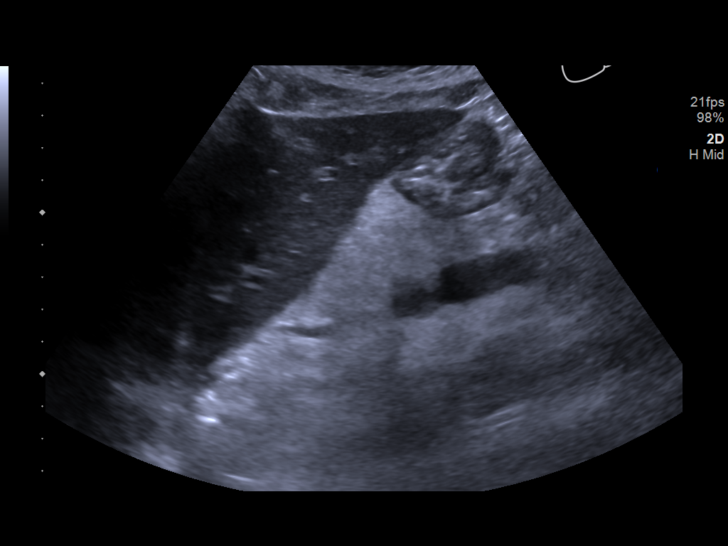
[im 32/48]
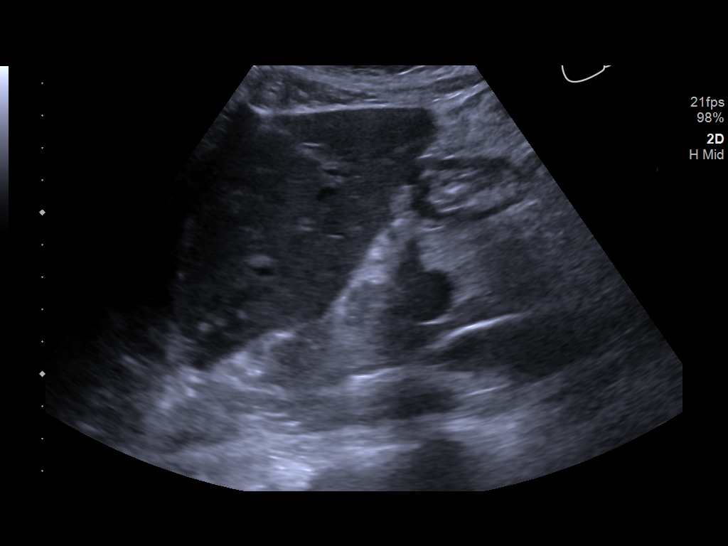
[im 36/48]
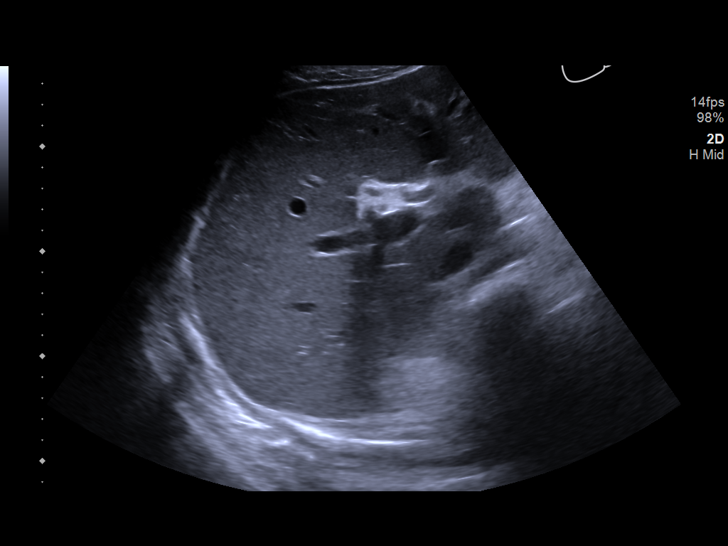
[im 40/48]
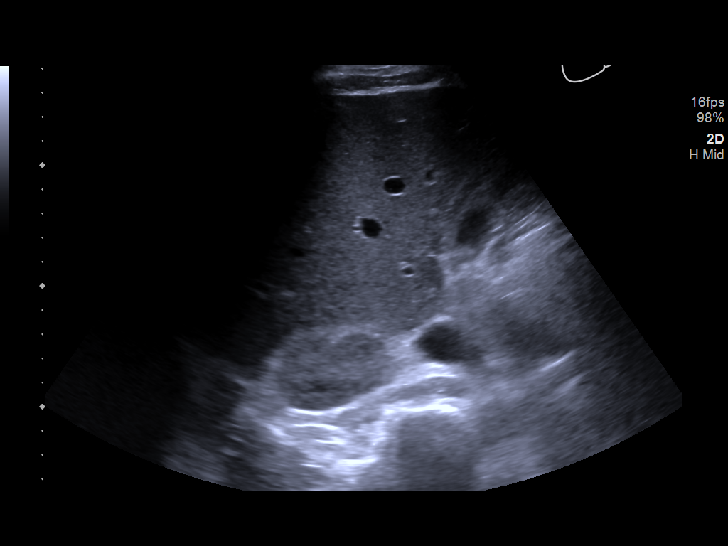
[im 44/48]
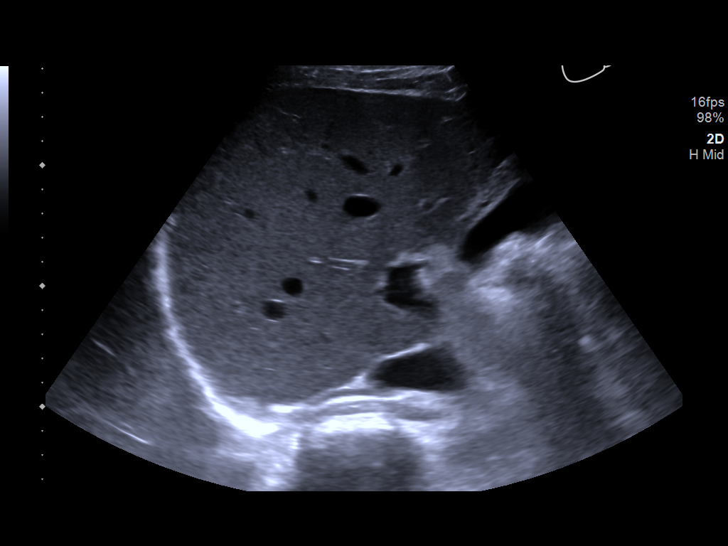
[im 48/48]
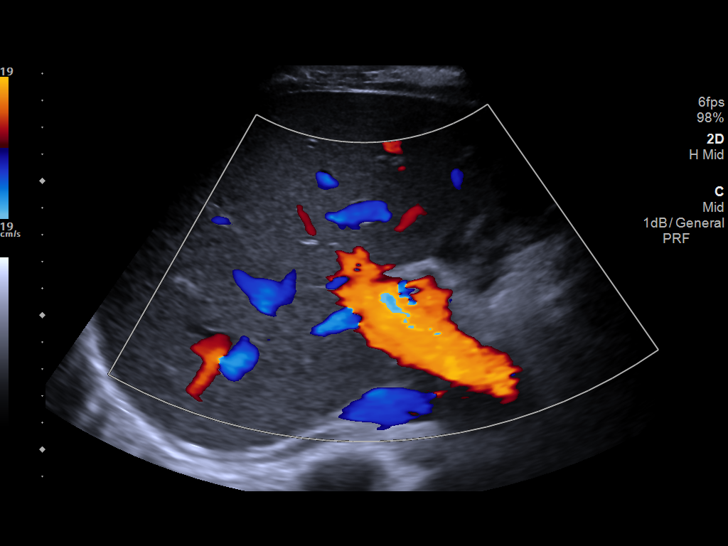

[14 of 25 positions shown; findings below may reference images not displayed]

FINDINGS: Gallbladder:

Physiologically distended. Diffuse edematous gallbladder wall
thickening with wall thickness of 1.3 cm. No gallstones or sludge.
Small amount pericholecystic fluid. No sonographic Murphy sign noted
by sonographer.

Common bile duct:

Diameter: 3 mm, normal.

Liver:

No focal lesion identified. Within normal limits in parenchymal
echogenicity. Portal vein is patent on color Doppler imaging with
normal direction of blood flow towards the liver.
IMPRESSION: 1. Edematous gallbladder wall thickening with small amount of
pericholecystic fluid. No gallstones. The degree of wall thickening
is more commonly seen with systemic causes or underlying hepatic
abnormalities such as hepatitis rather than primary gallbladder
inflammation.
2. No biliary dilatation.
3. Normal sonographic appearance of the liver.
